# Patient Record
Sex: Female | Born: 1955
Health system: Southern US, Community
[De-identification: ages and names within clinical notes are randomized; demographics above are authoritative.]

## PROBLEM LIST (undated history)

## (undated) DIAGNOSIS — E079 Disorder of thyroid, unspecified: Secondary | ICD-10-CM

## (undated) DIAGNOSIS — I10 Essential (primary) hypertension: Secondary | ICD-10-CM

## (undated) DIAGNOSIS — M1990 Unspecified osteoarthritis, unspecified site: Secondary | ICD-10-CM

## (undated) DIAGNOSIS — Z8701 Personal history of pneumonia (recurrent): Secondary | ICD-10-CM

## (undated) DIAGNOSIS — J189 Pneumonia, unspecified organism: Secondary | ICD-10-CM

## (undated) DIAGNOSIS — T7840XA Allergy, unspecified, initial encounter: Secondary | ICD-10-CM

## (undated) DIAGNOSIS — E039 Hypothyroidism, unspecified: Secondary | ICD-10-CM

## (undated) DIAGNOSIS — K219 Gastro-esophageal reflux disease without esophagitis: Secondary | ICD-10-CM

## (undated) DIAGNOSIS — E669 Obesity, unspecified: Secondary | ICD-10-CM

## (undated) HISTORY — DX: Obesity, unspecified: E66.9

## (undated) HISTORY — PX: COLONOSCOPY: SHX174

## (undated) HISTORY — DX: Essential (primary) hypertension: I10

## (undated) HISTORY — DX: Unspecified osteoarthritis, unspecified site: M19.90

## (undated) HISTORY — PX: UPPER GASTROINTESTINAL ENDOSCOPY: SHX188

## (undated) HISTORY — DX: Allergy, unspecified, initial encounter: T78.40XA

## (undated) HISTORY — DX: Personal history of pneumonia (recurrent): Z87.01

## (undated) HISTORY — DX: Disorder of thyroid, unspecified: E07.9

## (undated) HISTORY — PX: KNEE ARTHROSCOPY: SHX127

## (undated) HISTORY — DX: Gastro-esophageal reflux disease without esophagitis: K21.9

---

## 2003-06-05 ENCOUNTER — Encounter: Admission: RE | Admit: 2003-06-05 | Discharge: 2003-06-05 | Payer: Self-pay | Admitting: Internal Medicine

## 2003-07-16 ENCOUNTER — Other Ambulatory Visit: Admission: RE | Admit: 2003-07-16 | Discharge: 2003-07-16 | Payer: Self-pay | Admitting: Obstetrics & Gynecology

## 2004-06-22 ENCOUNTER — Other Ambulatory Visit: Admission: RE | Admit: 2004-06-22 | Discharge: 2004-06-22 | Payer: Self-pay | Admitting: Obstetrics & Gynecology

## 2007-05-06 ENCOUNTER — Encounter: Admission: RE | Admit: 2007-05-06 | Discharge: 2007-05-06 | Payer: Self-pay | Admitting: Family Medicine

## 2007-05-10 ENCOUNTER — Ambulatory Visit: Payer: Self-pay | Admitting: Gastroenterology

## 2007-06-08 ENCOUNTER — Ambulatory Visit: Payer: Self-pay | Admitting: Gastroenterology

## 2010-03-14 ENCOUNTER — Encounter: Payer: Self-pay | Admitting: Internal Medicine

## 2011-05-25 ENCOUNTER — Telehealth: Payer: Self-pay | Admitting: Gastroenterology

## 2011-05-25 ENCOUNTER — Encounter: Payer: Self-pay | Admitting: Gastroenterology

## 2011-05-25 ENCOUNTER — Ambulatory Visit (INDEPENDENT_AMBULATORY_CARE_PROVIDER_SITE_OTHER): Payer: BC Managed Care – HMO | Admitting: Gastroenterology

## 2011-05-25 VITALS — BP 136/68 | HR 84 | Ht 67.0 in | Wt 237.5 lb

## 2011-05-25 DIAGNOSIS — K219 Gastro-esophageal reflux disease without esophagitis: Secondary | ICD-10-CM

## 2011-05-25 DIAGNOSIS — R1110 Vomiting, unspecified: Secondary | ICD-10-CM | POA: Insufficient documentation

## 2011-05-25 NOTE — Telephone Encounter (Signed)
Spoke with patient and scheduled her to be seen today at 2:15 PM. Lew Dawes aware and she will fax last physical exam on patient.

## 2011-05-25 NOTE — Patient Instructions (Signed)
You have been scheduled for an endoscopy with propofol. Please follow written instructions given to you at your visit today.  

## 2011-05-25 NOTE — Assessment & Plan Note (Signed)
Plan to continue Protonix

## 2011-05-25 NOTE — Assessment & Plan Note (Addendum)
Recurrent postprandial vomiting raises the question of a gastric outlet obstruction. Gastroparesis is also a concern.  Recommendations #1 upper endoscopy #2 gastric emptying scan if #1 is not diagnostic #3 continue protonix

## 2011-05-25 NOTE — Progress Notes (Signed)
History of Present Illness: Stacey Powell is a pleasant 56 year old white female referred at the request of Dr. Chilton Si for evaluation of vomiting. For several months she's been experiencing postprandial vomiting. This usually occurs one to 2 hours after eating. She denies nausea but has a sudden urge to vomit. She denies abdominal pain. She does have pyrosis intermittently for which he takes Protonix. She rarely takes Advil. In the distant past she's had occasional episodes of severe upper abdominal pain but none recently. Weight actually has increased. She denies dysphagia. There has been no change in her bowel habits.    Past Medical History  Diagnosis Date  . Arthritis   . Obesity   . History of pneumonia    Past Surgical History  Procedure Date  . Knee arthroscopy     left knee   family history includes Cancer in her paternal grandmother; Colon polyps in her mother; Cystic fibrosis in her other; Diabetes in her maternal grandfather; Heart disease in her maternal grandfather; and Lung cancer in her paternal grandfather. Current Outpatient Prescriptions  Medication Sig Dispense Refill  . ibuprofen (ADVIL,MOTRIN) 200 MG tablet Take 600 mg by mouth as needed.      . pantoprazole (PROTONIX) 40 MG tablet Take 40 mg by mouth daily.       Allergies as of 05/25/2011 - Review Complete 05/25/2011  Allergen Reaction Noted  . Penicillins  05/25/2011    reports that she quit smoking about 9 years ago. Her smoking use included Cigarettes. She has never used smokeless tobacco. She reports that she drinks alcohol. She reports that she does not use illicit drugs.     Review of Systems: Pertinent positive and negative review of systems were noted in the above HPI section. All other review of systems were otherwise negative.  Vital signs were reviewed in today's medical record Physical Exam: General: Well developed , well nourished, no acute distress Head: Normocephalic and atraumatic Eyes:  sclerae  anicteric, EOMI Ears: Normal auditory acuity Mouth: No deformity or lesions Neck: Supple, no masses or thyromegaly Lungs: Clear throughout to auscultation Heart: Regular rate and rhythm; no murmurs, rubs or bruits Abdomen: Soft, non tender and non distended. No masses, hepatosplenomegaly or hernias noted. Normal Bowel sounds; there is no succussion splash Rectal:deferred Musculoskeletal: Symmetrical with no gross deformities  Skin: No lesions on visible extremities Pulses:  Normal pulses noted Extremities: No clubbing, cyanosis, edema or deformities noted Neurological: Alert oriented x 4, grossly nonfocal Cervical Nodes:  No significant cervical adenopathy Inguinal Nodes: No significant inguinal adenopathy Psychological:  Alert and cooperative. Normal mood and affect

## 2011-06-10 ENCOUNTER — Encounter: Payer: Self-pay | Admitting: Gastroenterology

## 2011-06-10 ENCOUNTER — Other Ambulatory Visit: Payer: Self-pay | Admitting: Gastroenterology

## 2011-06-10 ENCOUNTER — Telehealth: Payer: Self-pay

## 2011-06-10 ENCOUNTER — Ambulatory Visit (AMBULATORY_SURGERY_CENTER): Payer: BC Managed Care – HMO | Admitting: Gastroenterology

## 2011-06-10 VITALS — BP 141/92 | HR 75 | Temp 98.2°F | Resp 17 | Ht 67.0 in | Wt 237.0 lb

## 2011-06-10 DIAGNOSIS — K2970 Gastritis, unspecified, without bleeding: Secondary | ICD-10-CM

## 2011-06-10 DIAGNOSIS — K219 Gastro-esophageal reflux disease without esophagitis: Secondary | ICD-10-CM

## 2011-06-10 DIAGNOSIS — R1110 Vomiting, unspecified: Secondary | ICD-10-CM

## 2011-06-10 DIAGNOSIS — K319 Disease of stomach and duodenum, unspecified: Secondary | ICD-10-CM

## 2011-06-10 MED ORDER — SODIUM CHLORIDE 0.9 % IV SOLN: 500.0000 mL | INTRAVENOUS | Status: DC

## 2011-06-10 NOTE — Telephone Encounter (Signed)
Pt scheduled for GES at [facility] 06/17/11 arrival time 9:45am for a 10am appt. Pt to be NPO after midnight and to hold her Protonix the night before. Pt aware of appt date and time.

## 2011-06-10 NOTE — Patient Instructions (Signed)
Discharge instructions given with verbal understanding. Handouts on esophagitis and gastritis given. Resume previous medications.YOU HAD AN ENDOSCOPIC PROCEDURE TODAY AT THE [facility]: Refer to the procedure report that was given to you for any specific questions about what was found during the examination.  If the procedure report does not answer your questions, please call your gastroenterologist to clarify.  If you requested that your care partner not be given the details of your procedure findings, then the procedure report has been included in a sealed envelope for you to review at your convenience later.  YOU SHOULD EXPECT: Some feelings of bloating in the abdomen. Passage of more gas than usual.  Walking can help get rid of the air that was put into your GI tract during the procedure and reduce the bloating. If you had a lower endoscopy (such as a colonoscopy or flexible sigmoidoscopy) you may notice spotting of blood in your stool or on the toilet paper. If you underwent a bowel prep for your procedure, then you may not have a normal bowel movement for a few days.  DIET: Your first meal following the procedure should be a light meal and then it is ok to progress to your normal diet.  A half-sandwich or bowl of soup is an example of a good first meal.  Heavy or fried foods are harder to digest and may make you feel nauseous or bloated.  Likewise meals heavy in dairy and vegetables can cause extra gas to form and this can also increase the bloating.  Drink plenty of fluids but you should avoid alcoholic beverages for 24 hours.  ACTIVITY: Your care partner should take you home directly after the procedure.  You should plan to take it easy, moving slowly for the rest of the day.  You can resume normal activity the day after the procedure however you should NOT DRIVE or use heavy machinery for 24 hours (because of the sedation medicines used during the test).    SYMPTOMS TO REPORT  IMMEDIATELY: A gastroenterologist can be reached at any hour.  During normal business hours, 8:30 AM to 5:00 PM Monday through Friday, call [phone].  After hours and on weekends, please call the GI answering service at [phone] who will take a message and have the physician on call contact you.   Following upper endoscopy (EGD)  Vomiting of blood or coffee ground material  New chest pain or pain under the shoulder blades  Painful or persistently difficult swallowing  New shortness of breath  Fever of 100F or higher  Black, tarry-looking stools  FOLLOW UP: If any biopsies were taken you will be contacted by phone or by letter within the next 1-3 weeks.  Call your gastroenterologist if you have not heard about the biopsies in 3 weeks.  Our staff will call the home number listed on your records the next business day following your procedure to check on you and address any questions or concerns that you may have at that time regarding the information given to you following your procedure. This is a courtesy call and so if there is no answer at the home number and we have not heard from you through the emergency physician on call, we will assume that you have returned to your regular daily activities without incident.  SIGNATURES/CONFIDENTIALITY: You and/or your care partner have signed paperwork which will be entered into your electronic medical record.  These signatures attest to the fact that that the information above  on your After Visit Summary has been reviewed and is understood.  Full responsibility of the confidentiality of this discharge information lies with you and/or your care-partner.

## 2011-06-10 NOTE — Progress Notes (Signed)
Patient did not experience any of the following events: a burn prior to discharge; a fall within the facility; wrong site/side/patient/procedure/implant event; or a hospital transfer or hospital admission upon discharge from the facility. (G8907) Patient did not have preoperative order for IV antibiotic SSI prophylaxis. (G8918)  

## 2011-06-10 NOTE — Op Note (Signed)
[facility] [address] [place], [place]  [address]  ENDOSCOPY PROCEDURE REPORT  PATIENT:  Stacey Powell, Stacey Powell  MR#:  [id number] BIRTHDATE:  11-01-1955, 55 yrs. old  GENDER:  female  ENDOSCOPIST:  Barbette Hair. Arlyce Dice, MD Referred by:  Nila Nephew, M.D.  PROCEDURE DATE:  06/10/2011 PROCEDURE:  EGD with biopsy, 43239 ASA CLASS:  Class II INDICATIONS:  nausea and vomiting  MEDICATIONS:   MAC sedation, administered by CRNA propofol 240mg IV, glycopyrrolate (Robinal) 0.2 mg IV, 0.6cc simethancone 0.6 cc PO TOPICAL ANESTHETIC:  DESCRIPTION OF PROCEDURE:   After the risks and benefits of the procedure were explained, informed consent was obtained.  The LB GIF-H180 [id number] endoscope was introduced through the mouth and advanced to the third portion of the duodenum.  The instrument was slowly withdrawn as the mucosa was fully examined. <<PROCEDUREIMAGES>>  Esophagitis was found at the gastroesophageal junction (see image6 and image5). Grade A esophagitis  Mild gastritis was found. Nonerosive gastritis in antrum. Bxs taken (see image3).  Otherwise the examination was normal (see image2 and image4).    Retroflexed views revealed no abnormalities.    The scope was then withdrawn from the patient and the procedure completed.  COMPLICATIONS:  None  ENDOSCOPIC IMPRESSION: 1) Esophagitis at the gastroesophageal junction 2) Mild gastritis 3) Otherwise normal examination  Findings do not explain etiology for nausea and vomiting  RECOMMENDATIONS: 1) My office will arrange for you to have a Gastric Emptying Scan performed. This is a radiology test that gives an idea of how well your stomach functions.  ______________________________ Barbette Hair Arlyce Dice, MD  CC:  n. eSIGNED:   Barbette Hair. Mabry Santarelli at 06/10/2011 01:57 PM  Kem Kays, [phone]

## 2011-06-13 ENCOUNTER — Telehealth: Payer: Self-pay | Admitting: *Deleted

## 2011-06-13 NOTE — Telephone Encounter (Signed)
  Follow up Call-  Call back number 06/10/2011  Post procedure Call Back phone  # [phone]  Permission to leave phone message Yes     Patient questions:  Do you have a fever, pain , or abdominal swelling? no Pain Score  0 *  Have you tolerated food without any problems? yes  Have you been able to return to your normal activities? yes  Do you have any questions about your discharge instructions: Diet   no Medications  no Follow up visit  no  Do you have questions or concerns about your Care? no  Actions: * If pain score is 4 or above: No action needed, pain <4. Pt. Had question about where [facility] had scheduled her procedure, she wanted to know if it was going to be done [facility] or [facility] site. Recommended to pt. To call hospital to verify site she should go to and phone number given to each site.

## 2011-06-17 ENCOUNTER — Encounter (HOSPITAL_COMMUNITY)
Admission: RE | Admit: 2011-06-17 | Discharge: 2011-06-17 | Disposition: A | Discharge status: Home / Self Care | Payer: BC Managed Care – HMO | Source: Ambulatory Visit | Attending: Gastroenterology | Admitting: Gastroenterology

## 2011-06-17 DIAGNOSIS — R109 Unspecified abdominal pain: Secondary | ICD-10-CM | POA: Insufficient documentation

## 2011-06-17 MED ORDER — TECHNETIUM TC 99M SULFUR COLLOID
2.0000 | Freq: Once | INTRAVENOUS | Status: AC | PRN
Start: 2011-06-17 — End: 2011-06-17
  Administered 2011-06-17: 2 via INTRAVENOUS

## 2011-06-20 ENCOUNTER — Telehealth: Payer: Self-pay | Admitting: *Deleted

## 2011-06-20 NOTE — Telephone Encounter (Signed)
Message copied by Marlowe Kays on Mon Jun 20, 2011 12:50 PM ------      Message from: Melvia Heaps D      Created: Fri Jun 17, 2011  1:58 PM       GES in normal.  Please order SBFT

## 2011-06-21 ENCOUNTER — Encounter: Payer: Self-pay | Admitting: Gastroenterology

## 2011-06-22 ENCOUNTER — Ambulatory Visit: Payer: Self-pay | Admitting: Gastroenterology

## 2011-06-24 ENCOUNTER — Ambulatory Visit (HOSPITAL_COMMUNITY)
Admission: RE | Admit: 2011-06-24 | Discharge: 2011-06-24 | Disposition: A | Discharge status: Home / Self Care | Payer: BC Managed Care – HMO | Source: Ambulatory Visit | Attending: Gastroenterology | Admitting: Gastroenterology

## 2011-06-24 DIAGNOSIS — R109 Unspecified abdominal pain: Secondary | ICD-10-CM | POA: Insufficient documentation

## 2011-07-19 ENCOUNTER — Ambulatory Visit (INDEPENDENT_AMBULATORY_CARE_PROVIDER_SITE_OTHER): Payer: 59 | Admitting: Gastroenterology

## 2011-07-19 ENCOUNTER — Encounter: Payer: Self-pay | Admitting: Gastroenterology

## 2011-07-19 VITALS — BP 126/68 | HR 64 | Ht 67.0 in | Wt 239.8 lb

## 2011-07-19 DIAGNOSIS — R1110 Vomiting, unspecified: Secondary | ICD-10-CM

## 2011-07-19 NOTE — Assessment & Plan Note (Signed)
This has resolved. I suspect that nausea and vomiting are diet-related. She was encouraged to limit fatty foods.

## 2011-07-19 NOTE — Patient Instructions (Signed)
Follow up as needed

## 2011-07-19 NOTE — Progress Notes (Signed)
History of Present Illness:  Mrs. Greggs has returned for followup of nausea. Upper endoscopy demonstrated mild gastritis. Gastric emptying scan and upper GI series were unremarkable. With mild dietary restrictions her nausea has subsided. Specifically, she has reduced fatty foods including meats and has noted marked improvement in her symptoms    Review of Systems: She complains of left knee pain that limits her ability to exercise Pertinent positive and negative review of systems were noted in the above HPI section. All other review of systems were otherwise negative.    Current Medications, Allergies, Past Medical History, Past Surgical History, Family History and Social History were reviewed in [employer] electronic medical record  Vital signs were reviewed in today's medical record. Physical Exam: General: Well developed , well nourished, no acute distress

## 2013-11-04 ENCOUNTER — Encounter: Payer: Self-pay | Admitting: Gastroenterology

## 2016-06-16 DIAGNOSIS — F419 Anxiety disorder, unspecified: Secondary | ICD-10-CM | POA: Diagnosis not present

## 2016-06-16 DIAGNOSIS — Z0000 Encounter for general adult medical examination without abnormal findings: Secondary | ICD-10-CM | POA: Diagnosis not present

## 2016-06-16 DIAGNOSIS — Z6841 Body Mass Index (BMI) 40.0 and over, adult: Secondary | ICD-10-CM | POA: Diagnosis not present

## 2016-06-16 DIAGNOSIS — E039 Hypothyroidism, unspecified: Secondary | ICD-10-CM | POA: Diagnosis not present

## 2016-06-16 DIAGNOSIS — I10 Essential (primary) hypertension: Secondary | ICD-10-CM | POA: Diagnosis not present

## 2016-08-04 ENCOUNTER — Other Ambulatory Visit: Payer: Self-pay | Admitting: Internal Medicine

## 2016-08-04 ENCOUNTER — Ambulatory Visit
Admission: RE | Admit: 2016-08-04 | Discharge: 2016-08-04 | Disposition: A | Discharge status: Home / Self Care | Payer: BLUE CROSS/BLUE SHIELD | Source: Ambulatory Visit | Attending: Internal Medicine | Admitting: Internal Medicine

## 2016-08-04 DIAGNOSIS — R2242 Localized swelling, mass and lump, left lower limb: Secondary | ICD-10-CM

## 2016-08-04 DIAGNOSIS — I10 Essential (primary) hypertension: Secondary | ICD-10-CM | POA: Diagnosis not present

## 2016-08-04 DIAGNOSIS — M79672 Pain in left foot: Secondary | ICD-10-CM | POA: Diagnosis not present

## 2016-12-07 DIAGNOSIS — Z1231 Encounter for screening mammogram for malignant neoplasm of breast: Secondary | ICD-10-CM | POA: Diagnosis not present

## 2016-12-08 ENCOUNTER — Other Ambulatory Visit: Payer: Self-pay | Admitting: Internal Medicine

## 2016-12-08 DIAGNOSIS — R2242 Localized swelling, mass and lump, left lower limb: Secondary | ICD-10-CM

## 2016-12-08 DIAGNOSIS — F419 Anxiety disorder, unspecified: Secondary | ICD-10-CM | POA: Diagnosis not present

## 2016-12-08 DIAGNOSIS — Z23 Encounter for immunization: Secondary | ICD-10-CM | POA: Diagnosis not present

## 2016-12-08 DIAGNOSIS — I10 Essential (primary) hypertension: Secondary | ICD-10-CM | POA: Diagnosis not present

## 2016-12-09 ENCOUNTER — Ambulatory Visit
Admission: RE | Admit: 2016-12-09 | Discharge: 2016-12-09 | Disposition: A | Discharge status: Home / Self Care | Payer: BLUE CROSS/BLUE SHIELD | Source: Ambulatory Visit | Attending: Internal Medicine | Admitting: Internal Medicine

## 2016-12-09 DIAGNOSIS — R600 Localized edema: Secondary | ICD-10-CM | POA: Diagnosis not present

## 2016-12-09 DIAGNOSIS — R2242 Localized swelling, mass and lump, left lower limb: Secondary | ICD-10-CM

## 2017-04-04 DIAGNOSIS — I10 Essential (primary) hypertension: Secondary | ICD-10-CM | POA: Diagnosis not present

## 2017-04-04 DIAGNOSIS — F419 Anxiety disorder, unspecified: Secondary | ICD-10-CM | POA: Diagnosis not present

## 2017-04-04 DIAGNOSIS — E039 Hypothyroidism, unspecified: Secondary | ICD-10-CM | POA: Diagnosis not present

## 2017-04-06 ENCOUNTER — Encounter: Payer: Self-pay | Admitting: Gastroenterology

## 2017-05-05 ENCOUNTER — Encounter: Payer: Self-pay | Admitting: *Deleted

## 2017-05-25 ENCOUNTER — Ambulatory Visit (AMBULATORY_SURGERY_CENTER): Payer: Self-pay | Admitting: *Deleted

## 2017-05-25 ENCOUNTER — Other Ambulatory Visit: Payer: Self-pay

## 2017-05-25 VITALS — Ht 67.0 in | Wt 249.0 lb

## 2017-05-25 DIAGNOSIS — Z1211 Encounter for screening for malignant neoplasm of colon: Secondary | ICD-10-CM

## 2017-05-25 NOTE — Progress Notes (Signed)
No egg or soy allergy known to patient  No issues with past sedation with any surgeries  or procedures, no intubation problems  No diet pills per patient No home 02 use per patient  No blood thinners per patient  Pt denies issues with constipation  No A fib or A flutter  EMMI video sent to pt's e mail - pt declined  plenvu sample  [id number]  Exp 07-2018  As directed- not covered by [employer] - pt not wanting to pay $50

## 2017-05-26 ENCOUNTER — Encounter: Payer: Self-pay | Admitting: Gastroenterology

## 2017-06-08 ENCOUNTER — Other Ambulatory Visit: Payer: Self-pay

## 2017-06-08 ENCOUNTER — Encounter: Payer: Self-pay | Admitting: Gastroenterology

## 2017-06-08 ENCOUNTER — Ambulatory Visit (AMBULATORY_SURGERY_CENTER): Payer: BLUE CROSS/BLUE SHIELD | Admitting: Gastroenterology

## 2017-06-08 VITALS — BP 136/62 | HR 59 | Temp 97.3°F | Resp 14 | Ht 67.0 in | Wt 249.0 lb

## 2017-06-08 DIAGNOSIS — Z1212 Encounter for screening for malignant neoplasm of rectum: Secondary | ICD-10-CM

## 2017-06-08 DIAGNOSIS — Z1211 Encounter for screening for malignant neoplasm of colon: Secondary | ICD-10-CM

## 2017-06-08 MED ORDER — SODIUM CHLORIDE 0.9 % IV SOLN: 500.0000 mL | Freq: Once | INTRAVENOUS | Status: DC

## 2017-06-08 NOTE — Patient Instructions (Signed)
**  Handout given on Diverticulosis**   YOU HAD AN ENDOSCOPIC PROCEDURE TODAY: Refer to the procedure report and other information in the discharge instructions given to you for any specific questions about what was found during the examination. If this information does not answer your questions, please call [facility] office at [phone] to clarify.   YOU SHOULD EXPECT: Some feelings of bloating in the abdomen. Passage of more gas than usual. Walking can help get rid of the air that was put into your GI tract during the procedure and reduce the bloating. If you had a lower endoscopy (such as a colonoscopy or flexible sigmoidoscopy) you may notice spotting of blood in your stool or on the toilet paper. Some abdominal soreness may be present for a day or two, also.  DIET: Your first meal following the procedure should be a light meal and then it is ok to progress to your normal diet. A half-sandwich or bowl of soup is an example of a good first meal. Heavy or fried foods are harder to digest and may make you feel nauseous or bloated. Drink plenty of fluids but you should avoid alcoholic beverages for 24 hours. If you had a esophageal dilation, please see attached instructions for diet.    ACTIVITY: Your care partner should take you home directly after the procedure. You should plan to take it easy, moving slowly for the rest of the day. You can resume normal activity the day after the procedure however YOU SHOULD NOT DRIVE, use power tools, machinery or perform tasks that involve climbing or major physical exertion for 24 hours (because of the sedation medicines used during the test).   SYMPTOMS TO REPORT IMMEDIATELY: A gastroenterologist can be reached at any hour. Please call [phone]  for any of the following symptoms:  Following lower endoscopy (colonoscopy, flexible sigmoidoscopy) Excessive amounts of blood in the stool  Significant tenderness, worsening of abdominal pains  Swelling of the  abdomen that is new, acute  Fever of 100 or higher    FOLLOW UP:  If any biopsies were taken you will be contacted by phone or by letter within the next 1-3 weeks. Call [phone]  if you have not heard about the biopsies in 3 weeks.  Please also call with any specific questions about appointments or follow up tests.  

## 2017-06-08 NOTE — Progress Notes (Signed)
Pt's states no medical or surgical changes since previsit or office visit. 

## 2017-06-08 NOTE — Progress Notes (Signed)
To recovery, report to RN, VSS. 

## 2017-06-08 NOTE — Op Note (Signed)
[facility] Patient Name: Stacey Powell Procedure Date: 06/08/2017 9:29 AM MRN: [id number] Endoscopist: Sherilyn Cooter L. Myrtie Neither , MD Age: 62 Referring MD:  Date of Birth: Oct 07, 1955 Gender: Female Account #: [id number] Procedure:                Colonoscopy Indications:              Screening for colorectal malignant neoplasm (no                            polyps 05/2007) Medicines:                Monitored Anesthesia Care Procedure:                Pre-Anesthesia Assessment:                           - Prior to the procedure, a History and Physical                            was performed, and patient medications and                            allergies were reviewed. The patient's tolerance of                            previous anesthesia was also reviewed. The risks                            and benefits of the procedure and the sedation                            options and risks were discussed with the patient.                            All questions were answered, and informed consent                            was obtained. Prior Anticoagulants: The patient has                            taken no previous anticoagulant or antiplatelet                            agents. ASA Grade Assessment: III - A patient with                            severe systemic disease. After reviewing the risks                            and benefits, the patient was deemed in                            satisfactory condition to undergo the procedure.  After obtaining informed consent, the colonoscope                            was passed under direct vision. Throughout the                            procedure, the patient's blood pressure, pulse, and                            oxygen saturations were monitored continuously. The                            Colonoscope was introduced through the anus and                            advanced to the the cecum, identified by                          appendiceal orifice and ileocecal valve. The                            colonoscopy was performed without difficulty. The                            patient tolerated the procedure well. The quality                            of the bowel preparation was good. The ileocecal                            valve, appendiceal orifice, and rectum were                            photographed. The quality of the bowel preparation                            was evaluated using the BBPS [facility]([place] Bowel                            Preparation Scale) with scores of: Right Colon = 2,                            Transverse Colon = 2 and Left Colon = 2. The total                            BBPS score equals 6. The bowel preparation used was                            Plenvu. Scope In: 9:43:54 AM Scope Out: 9:57:46 AM Scope Withdrawal Time: 0 hours 9 minutes 40 seconds  Total Procedure Duration: 0 hours 13 minutes 52 seconds  Findings:                 The perianal and digital rectal examinations were  normal.                           Many diverticula were found in the entire colon.                           The exam was otherwise without abnormality on                            direct and retroflexion views. Complications:            No immediate complications. Estimated Blood Loss:     Estimated blood loss: none. Impression:               - Diverticulosis in the entire examined colon.                           - The examination was otherwise normal on direct                            and retroflexion views.                           - No specimens collected. Recommendation:           - Patient has a contact number available for                            emergencies. The signs and symptoms of potential                            delayed complications were discussed with the                            patient. Return to normal activities tomorrow.                             Written discharge instructions were provided to the                            patient.                           - Resume previous diet.                           - Continue present medications.                           - Repeat colonoscopy in 10 years for screening                            purposes.  L. Myrtie Neither, MD 06/08/2017 10:01:08 AM This report has been signed electronically.

## 2017-06-12 ENCOUNTER — Telehealth: Payer: Self-pay

## 2017-06-12 NOTE — Telephone Encounter (Signed)
  Follow up Call-  Call back number 06/08/2017  Post procedure Call Back phone  # [phone]  Permission to leave phone message Yes  Some recent data might be hidden     Patient questions:  Do you have a fever, pain , or abdominal swelling? No. Pain Score  0 *  Have you tolerated food without any problems? Yes.    Have you been able to return to your normal activities? Yes.    Do you have any questions about your discharge instructions: Diet   No. Medications  No. Follow up visit  No.  Do you have questions or concerns about your Care? No.  Actions: * If pain score is 4 or above: No action needed, pain <4.

## 2017-06-19 DIAGNOSIS — Z0000 Encounter for general adult medical examination without abnormal findings: Secondary | ICD-10-CM | POA: Diagnosis not present

## 2017-06-19 DIAGNOSIS — K219 Gastro-esophageal reflux disease without esophagitis: Secondary | ICD-10-CM | POA: Diagnosis not present

## 2017-06-19 DIAGNOSIS — I10 Essential (primary) hypertension: Secondary | ICD-10-CM | POA: Diagnosis not present

## 2017-06-19 DIAGNOSIS — F419 Anxiety disorder, unspecified: Secondary | ICD-10-CM | POA: Diagnosis not present

## 2017-06-22 ENCOUNTER — Other Ambulatory Visit: Payer: Self-pay | Admitting: Internal Medicine

## 2017-06-22 DIAGNOSIS — N289 Disorder of kidney and ureter, unspecified: Secondary | ICD-10-CM

## 2017-06-27 ENCOUNTER — Ambulatory Visit
Admission: RE | Admit: 2017-06-27 | Discharge: 2017-06-27 | Disposition: A | Discharge status: Home / Self Care | Payer: BLUE CROSS/BLUE SHIELD | Source: Ambulatory Visit | Attending: Internal Medicine | Admitting: Internal Medicine

## 2017-06-27 DIAGNOSIS — R3129 Other microscopic hematuria: Secondary | ICD-10-CM | POA: Diagnosis not present

## 2017-06-27 DIAGNOSIS — N289 Disorder of kidney and ureter, unspecified: Secondary | ICD-10-CM

## 2017-07-12 ENCOUNTER — Other Ambulatory Visit: Payer: BLUE CROSS/BLUE SHIELD

## 2017-08-01 DIAGNOSIS — H43813 Vitreous degeneration, bilateral: Secondary | ICD-10-CM | POA: Diagnosis not present

## 2017-08-28 DIAGNOSIS — I10 Essential (primary) hypertension: Secondary | ICD-10-CM | POA: Diagnosis not present

## 2017-08-28 DIAGNOSIS — K219 Gastro-esophageal reflux disease without esophagitis: Secondary | ICD-10-CM | POA: Diagnosis not present

## 2017-08-28 DIAGNOSIS — F419 Anxiety disorder, unspecified: Secondary | ICD-10-CM | POA: Diagnosis not present

## 2017-08-28 DIAGNOSIS — R319 Hematuria, unspecified: Secondary | ICD-10-CM | POA: Diagnosis not present

## 2017-09-08 DIAGNOSIS — J019 Acute sinusitis, unspecified: Secondary | ICD-10-CM | POA: Diagnosis not present

## 2017-12-08 DIAGNOSIS — Z1231 Encounter for screening mammogram for malignant neoplasm of breast: Secondary | ICD-10-CM | POA: Diagnosis not present

## 2017-12-19 DIAGNOSIS — K219 Gastro-esophageal reflux disease without esophagitis: Secondary | ICD-10-CM | POA: Diagnosis not present

## 2017-12-19 DIAGNOSIS — I10 Essential (primary) hypertension: Secondary | ICD-10-CM | POA: Diagnosis not present

## 2017-12-19 DIAGNOSIS — F419 Anxiety disorder, unspecified: Secondary | ICD-10-CM | POA: Diagnosis not present

## 2018-06-21 ENCOUNTER — Other Ambulatory Visit: Payer: Self-pay | Admitting: Internal Medicine

## 2018-06-21 DIAGNOSIS — R109 Unspecified abdominal pain: Secondary | ICD-10-CM

## 2018-06-21 DIAGNOSIS — R319 Hematuria, unspecified: Secondary | ICD-10-CM | POA: Diagnosis not present

## 2018-06-21 DIAGNOSIS — K219 Gastro-esophageal reflux disease without esophagitis: Secondary | ICD-10-CM | POA: Diagnosis not present

## 2018-06-21 DIAGNOSIS — E039 Hypothyroidism, unspecified: Secondary | ICD-10-CM | POA: Diagnosis not present

## 2018-06-21 DIAGNOSIS — I10 Essential (primary) hypertension: Secondary | ICD-10-CM | POA: Diagnosis not present

## 2018-06-28 ENCOUNTER — Other Ambulatory Visit: Payer: Self-pay

## 2018-06-28 ENCOUNTER — Ambulatory Visit
Admission: RE | Admit: 2018-06-28 | Discharge: 2018-06-28 | Disposition: A | Discharge status: Home / Self Care | Payer: BLUE CROSS/BLUE SHIELD | Source: Ambulatory Visit | Attending: Internal Medicine | Admitting: Internal Medicine

## 2018-06-28 DIAGNOSIS — R109 Unspecified abdominal pain: Secondary | ICD-10-CM

## 2018-06-28 DIAGNOSIS — R319 Hematuria, unspecified: Secondary | ICD-10-CM | POA: Diagnosis not present

## 2018-11-18 IMAGING — US US EXTREM LOW*L* LIMITED
1 series · 14 of 16 positions shown · non-contrast
Comparison: Left foot radiographs 08/04/2016

CLINICAL DATA: 61-year-old female with a palpable abnormality rest
of the lateral aspect of the left foot

EXAM:
ULTRASOUND LEFT  LOWER EXTREMITY LIMITED
TECHNIQUE: Ultrasound examination of the lower extremity soft tissues was
performed in the area of clinical concern.

[Series 1: us extrem low*left* limited · 0.05mm/px · 16 acquisitions, 14 frames shown]
[im 1/16]
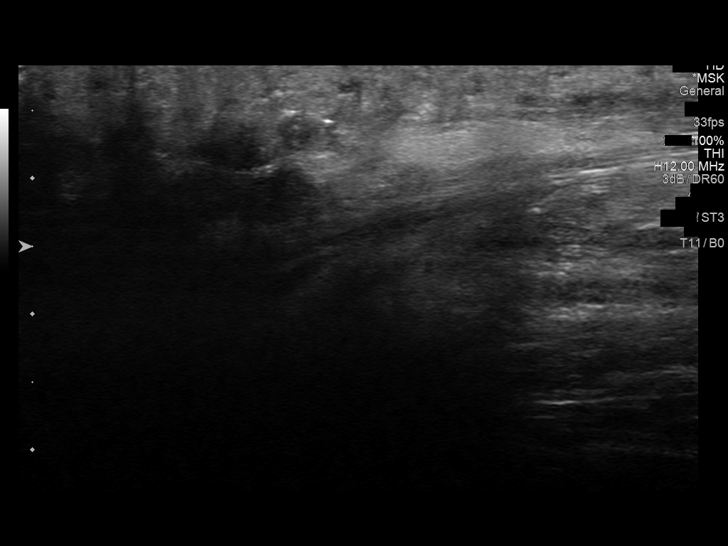
[im 2/16]
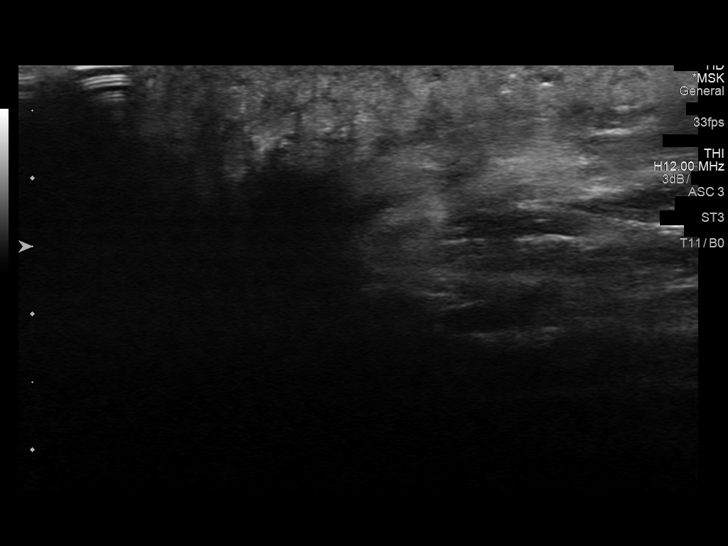
[im 3/16]
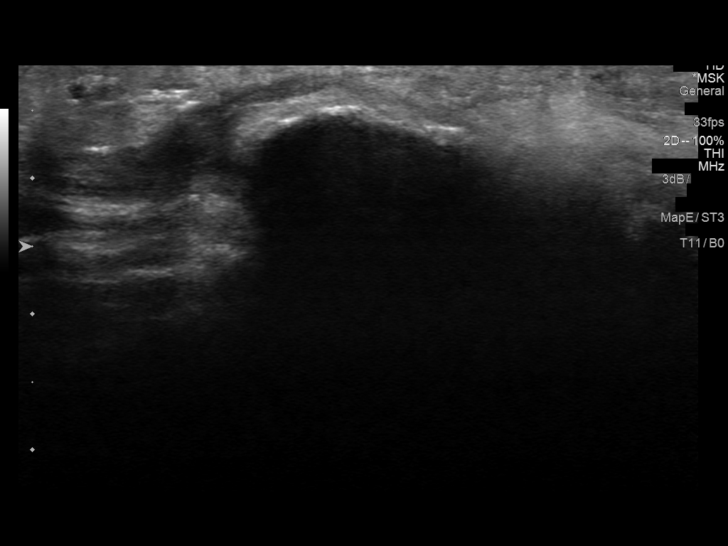
[im 5/16]
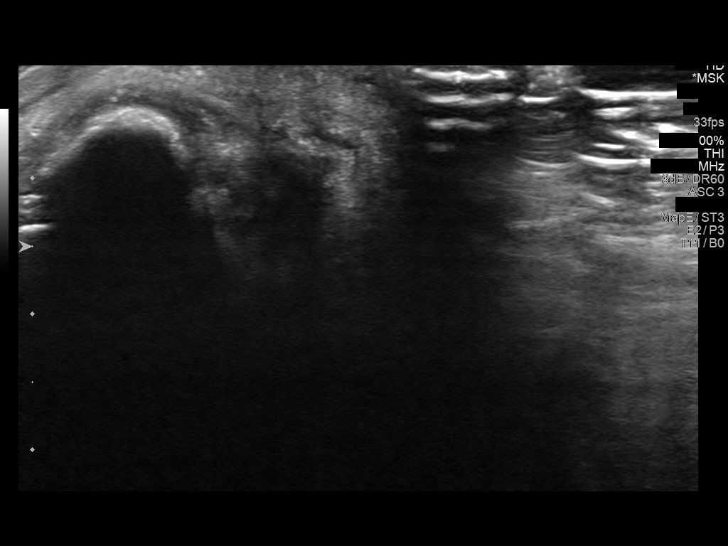
[im 6/16]
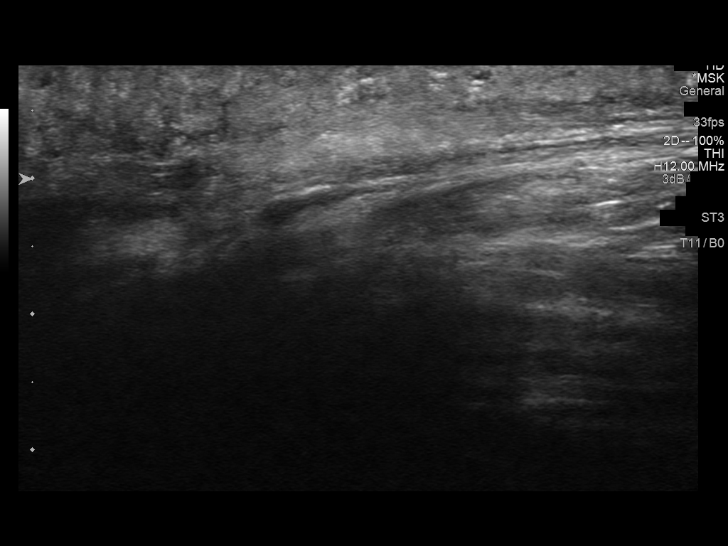
[im 7/16]
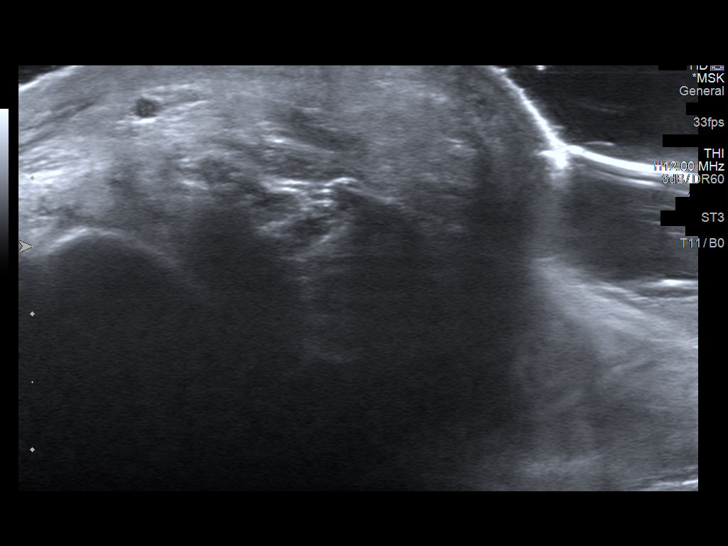
[im 8/16]
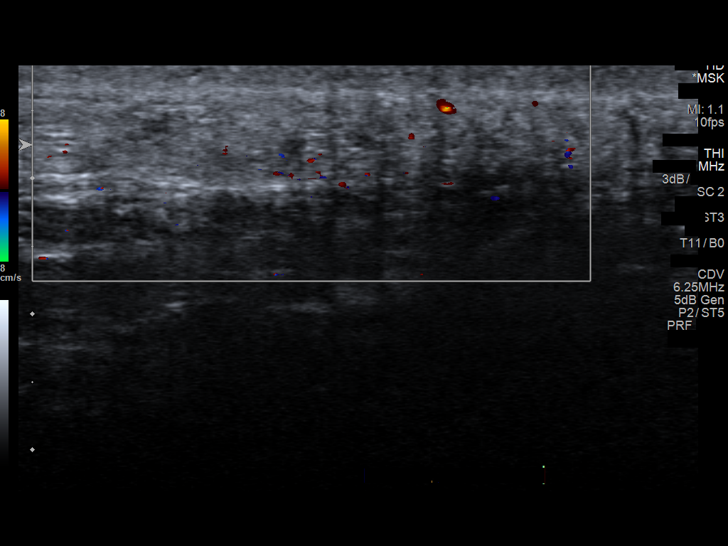
[im 9/16]
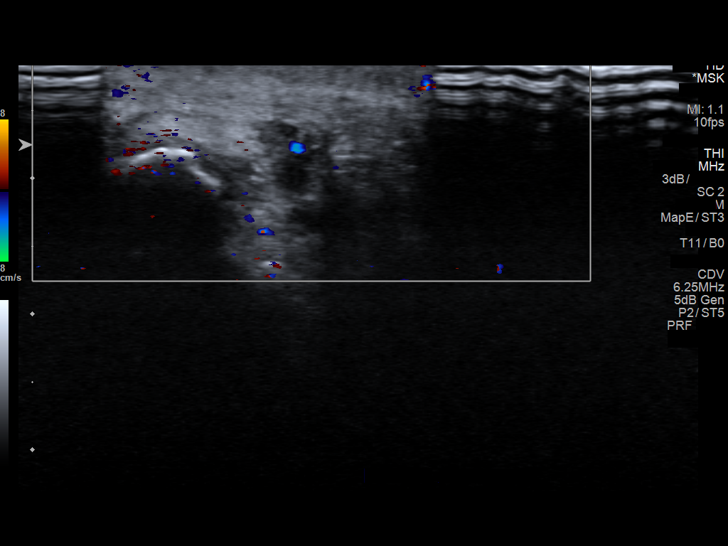
[im 10/16]
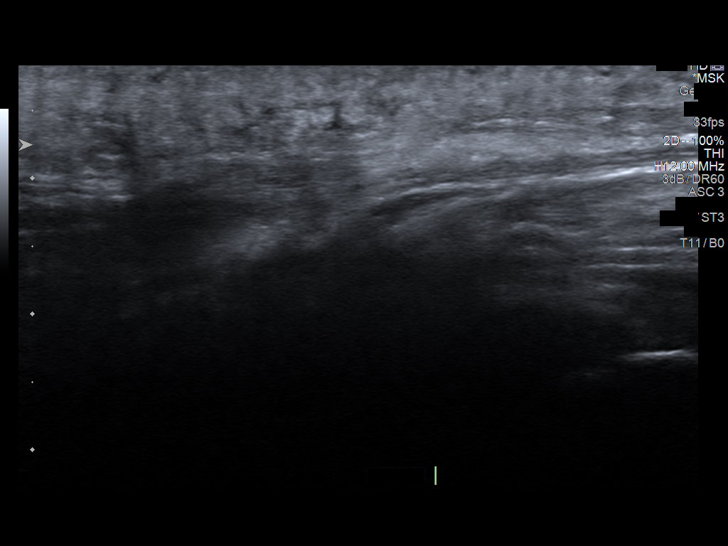
[im 11/16]
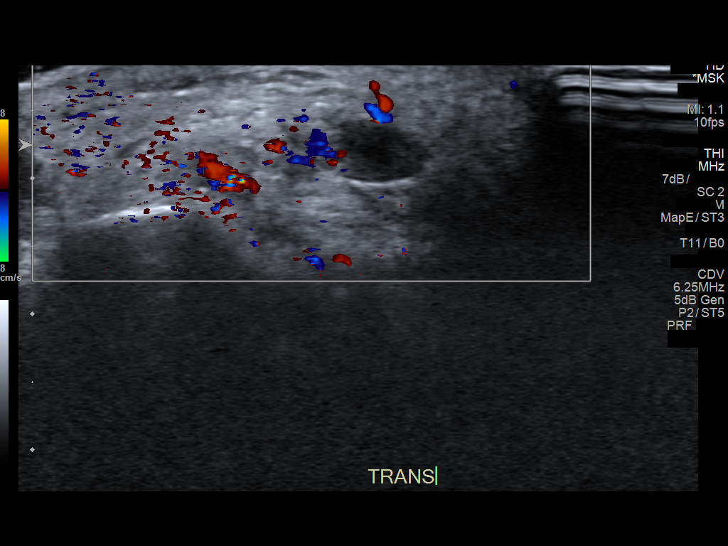
[im 13/16]
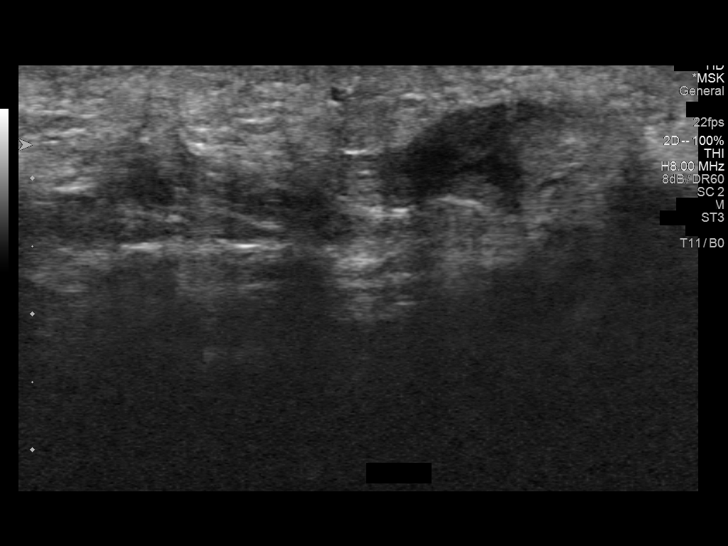
[im 14/16]
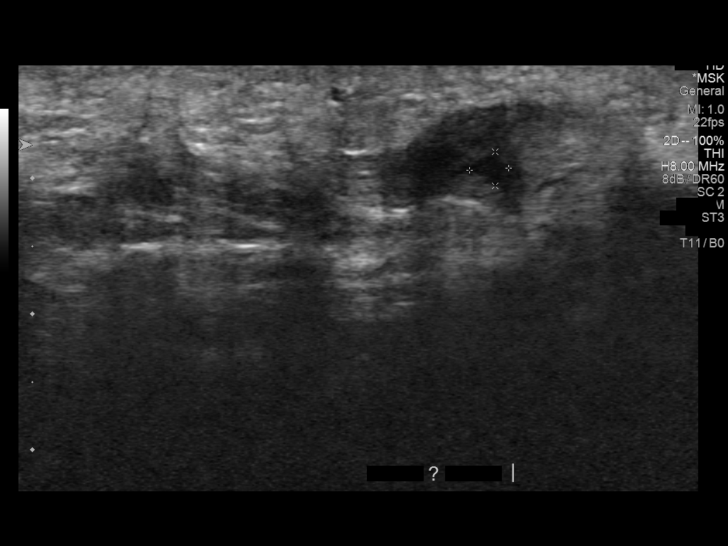
[im 15/16]
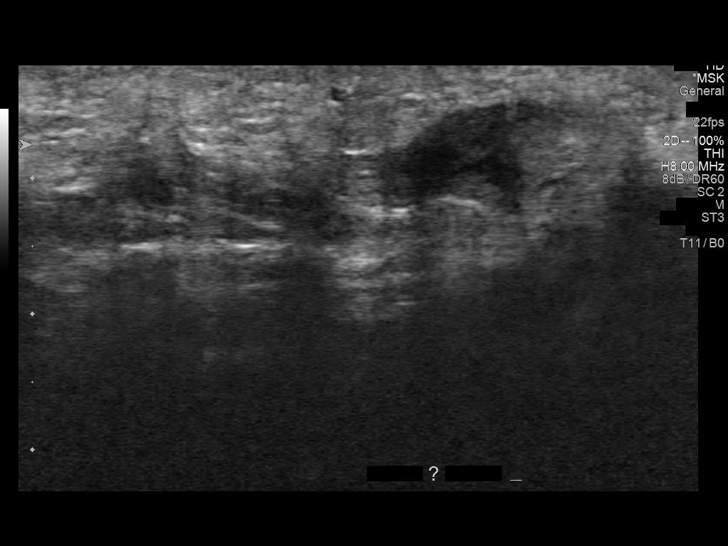
[im 16/16]
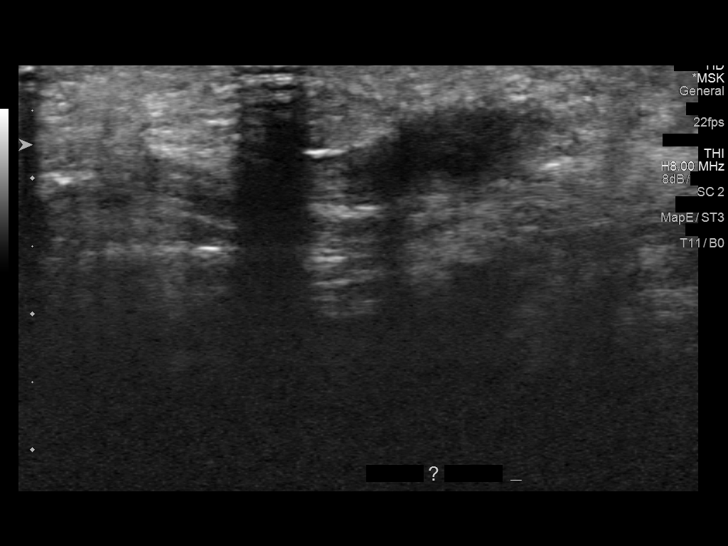

[14 of 16 positions shown; findings below may reference images not displayed]

FINDINGS: No definite soft tissue mass in the region of clinical concern.
There is a small collection of hypoechoic material which may
represent edema, or fluid.
IMPRESSION: Small amount of edema or fluid in the region of clinical concern at
the lateral aspect of the base of the fifth metatarsal. No definite
solid mass.

Consider further evaluation with MRI of the foot with without
contrast.

## 2018-12-18 DIAGNOSIS — M9901 Segmental and somatic dysfunction of cervical region: Secondary | ICD-10-CM | POA: Diagnosis not present

## 2018-12-18 DIAGNOSIS — M9905 Segmental and somatic dysfunction of pelvic region: Secondary | ICD-10-CM | POA: Diagnosis not present

## 2018-12-18 DIAGNOSIS — M50322 Other cervical disc degeneration at C5-C6 level: Secondary | ICD-10-CM | POA: Diagnosis not present

## 2018-12-18 DIAGNOSIS — M25551 Pain in right hip: Secondary | ICD-10-CM | POA: Diagnosis not present

## 2018-12-19 DIAGNOSIS — M50322 Other cervical disc degeneration at C5-C6 level: Secondary | ICD-10-CM | POA: Diagnosis not present

## 2018-12-19 DIAGNOSIS — M9905 Segmental and somatic dysfunction of pelvic region: Secondary | ICD-10-CM | POA: Diagnosis not present

## 2018-12-19 DIAGNOSIS — M9901 Segmental and somatic dysfunction of cervical region: Secondary | ICD-10-CM | POA: Diagnosis not present

## 2018-12-19 DIAGNOSIS — M25551 Pain in right hip: Secondary | ICD-10-CM | POA: Diagnosis not present

## 2018-12-20 DIAGNOSIS — M25551 Pain in right hip: Secondary | ICD-10-CM | POA: Diagnosis not present

## 2018-12-20 DIAGNOSIS — M9905 Segmental and somatic dysfunction of pelvic region: Secondary | ICD-10-CM | POA: Diagnosis not present

## 2018-12-20 DIAGNOSIS — M50322 Other cervical disc degeneration at C5-C6 level: Secondary | ICD-10-CM | POA: Diagnosis not present

## 2018-12-20 DIAGNOSIS — M9901 Segmental and somatic dysfunction of cervical region: Secondary | ICD-10-CM | POA: Diagnosis not present

## 2018-12-21 DIAGNOSIS — Z1231 Encounter for screening mammogram for malignant neoplasm of breast: Secondary | ICD-10-CM | POA: Diagnosis not present

## 2018-12-24 DIAGNOSIS — M9905 Segmental and somatic dysfunction of pelvic region: Secondary | ICD-10-CM | POA: Diagnosis not present

## 2018-12-24 DIAGNOSIS — M25551 Pain in right hip: Secondary | ICD-10-CM | POA: Diagnosis not present

## 2018-12-24 DIAGNOSIS — M9901 Segmental and somatic dysfunction of cervical region: Secondary | ICD-10-CM | POA: Diagnosis not present

## 2018-12-24 DIAGNOSIS — M50322 Other cervical disc degeneration at C5-C6 level: Secondary | ICD-10-CM | POA: Diagnosis not present

## 2018-12-27 DIAGNOSIS — M9901 Segmental and somatic dysfunction of cervical region: Secondary | ICD-10-CM | POA: Diagnosis not present

## 2018-12-27 DIAGNOSIS — M50322 Other cervical disc degeneration at C5-C6 level: Secondary | ICD-10-CM | POA: Diagnosis not present

## 2018-12-27 DIAGNOSIS — M25551 Pain in right hip: Secondary | ICD-10-CM | POA: Diagnosis not present

## 2018-12-27 DIAGNOSIS — M9905 Segmental and somatic dysfunction of pelvic region: Secondary | ICD-10-CM | POA: Diagnosis not present

## 2018-12-31 DIAGNOSIS — M9905 Segmental and somatic dysfunction of pelvic region: Secondary | ICD-10-CM | POA: Diagnosis not present

## 2018-12-31 DIAGNOSIS — M25551 Pain in right hip: Secondary | ICD-10-CM | POA: Diagnosis not present

## 2018-12-31 DIAGNOSIS — M9901 Segmental and somatic dysfunction of cervical region: Secondary | ICD-10-CM | POA: Diagnosis not present

## 2018-12-31 DIAGNOSIS — M50322 Other cervical disc degeneration at C5-C6 level: Secondary | ICD-10-CM | POA: Diagnosis not present

## 2019-01-03 DIAGNOSIS — M50322 Other cervical disc degeneration at C5-C6 level: Secondary | ICD-10-CM | POA: Diagnosis not present

## 2019-01-03 DIAGNOSIS — M25551 Pain in right hip: Secondary | ICD-10-CM | POA: Diagnosis not present

## 2019-01-03 DIAGNOSIS — M9901 Segmental and somatic dysfunction of cervical region: Secondary | ICD-10-CM | POA: Diagnosis not present

## 2019-01-03 DIAGNOSIS — M9905 Segmental and somatic dysfunction of pelvic region: Secondary | ICD-10-CM | POA: Diagnosis not present

## 2019-01-07 DIAGNOSIS — M9905 Segmental and somatic dysfunction of pelvic region: Secondary | ICD-10-CM | POA: Diagnosis not present

## 2019-01-07 DIAGNOSIS — M50322 Other cervical disc degeneration at C5-C6 level: Secondary | ICD-10-CM | POA: Diagnosis not present

## 2019-01-07 DIAGNOSIS — M25551 Pain in right hip: Secondary | ICD-10-CM | POA: Diagnosis not present

## 2019-01-07 DIAGNOSIS — M9901 Segmental and somatic dysfunction of cervical region: Secondary | ICD-10-CM | POA: Diagnosis not present

## 2019-01-10 DIAGNOSIS — M50322 Other cervical disc degeneration at C5-C6 level: Secondary | ICD-10-CM | POA: Diagnosis not present

## 2019-01-10 DIAGNOSIS — M25551 Pain in right hip: Secondary | ICD-10-CM | POA: Diagnosis not present

## 2019-01-10 DIAGNOSIS — M9901 Segmental and somatic dysfunction of cervical region: Secondary | ICD-10-CM | POA: Diagnosis not present

## 2019-01-10 DIAGNOSIS — M9905 Segmental and somatic dysfunction of pelvic region: Secondary | ICD-10-CM | POA: Diagnosis not present

## 2019-01-14 DIAGNOSIS — M9905 Segmental and somatic dysfunction of pelvic region: Secondary | ICD-10-CM | POA: Diagnosis not present

## 2019-01-14 DIAGNOSIS — M25551 Pain in right hip: Secondary | ICD-10-CM | POA: Diagnosis not present

## 2019-01-14 DIAGNOSIS — M50322 Other cervical disc degeneration at C5-C6 level: Secondary | ICD-10-CM | POA: Diagnosis not present

## 2019-01-14 DIAGNOSIS — M9901 Segmental and somatic dysfunction of cervical region: Secondary | ICD-10-CM | POA: Diagnosis not present

## 2019-01-21 DIAGNOSIS — M25551 Pain in right hip: Secondary | ICD-10-CM | POA: Diagnosis not present

## 2019-01-21 DIAGNOSIS — M50322 Other cervical disc degeneration at C5-C6 level: Secondary | ICD-10-CM | POA: Diagnosis not present

## 2019-01-21 DIAGNOSIS — M9901 Segmental and somatic dysfunction of cervical region: Secondary | ICD-10-CM | POA: Diagnosis not present

## 2019-01-21 DIAGNOSIS — M9905 Segmental and somatic dysfunction of pelvic region: Secondary | ICD-10-CM | POA: Diagnosis not present

## 2019-01-23 DIAGNOSIS — M9905 Segmental and somatic dysfunction of pelvic region: Secondary | ICD-10-CM | POA: Diagnosis not present

## 2019-01-23 DIAGNOSIS — M25551 Pain in right hip: Secondary | ICD-10-CM | POA: Diagnosis not present

## 2019-01-23 DIAGNOSIS — M9901 Segmental and somatic dysfunction of cervical region: Secondary | ICD-10-CM | POA: Diagnosis not present

## 2019-01-23 DIAGNOSIS — M50322 Other cervical disc degeneration at C5-C6 level: Secondary | ICD-10-CM | POA: Diagnosis not present

## 2019-01-28 DIAGNOSIS — M25551 Pain in right hip: Secondary | ICD-10-CM | POA: Diagnosis not present

## 2019-01-28 DIAGNOSIS — M9905 Segmental and somatic dysfunction of pelvic region: Secondary | ICD-10-CM | POA: Diagnosis not present

## 2019-01-28 DIAGNOSIS — M50322 Other cervical disc degeneration at C5-C6 level: Secondary | ICD-10-CM | POA: Diagnosis not present

## 2019-01-28 DIAGNOSIS — M9901 Segmental and somatic dysfunction of cervical region: Secondary | ICD-10-CM | POA: Diagnosis not present

## 2019-01-30 DIAGNOSIS — M9905 Segmental and somatic dysfunction of pelvic region: Secondary | ICD-10-CM | POA: Diagnosis not present

## 2019-01-30 DIAGNOSIS — M9901 Segmental and somatic dysfunction of cervical region: Secondary | ICD-10-CM | POA: Diagnosis not present

## 2019-01-30 DIAGNOSIS — M25551 Pain in right hip: Secondary | ICD-10-CM | POA: Diagnosis not present

## 2019-01-30 DIAGNOSIS — M50322 Other cervical disc degeneration at C5-C6 level: Secondary | ICD-10-CM | POA: Diagnosis not present

## 2020-06-06 IMAGING — US US RENAL
1 series · 14 of 25 positions shown · non-contrast
Comparison: 06/27/2017 renal sonogram.

CLINICAL DATA: Right flank pain for 2 months.  Chronic hematuria.

EXAM:
RENAL / URINARY TRACT ULTRASOUND COMPLETE

[Series 1: us renal · 0.28mm/px · 14 of 45 slices shown]
[im 1/45]
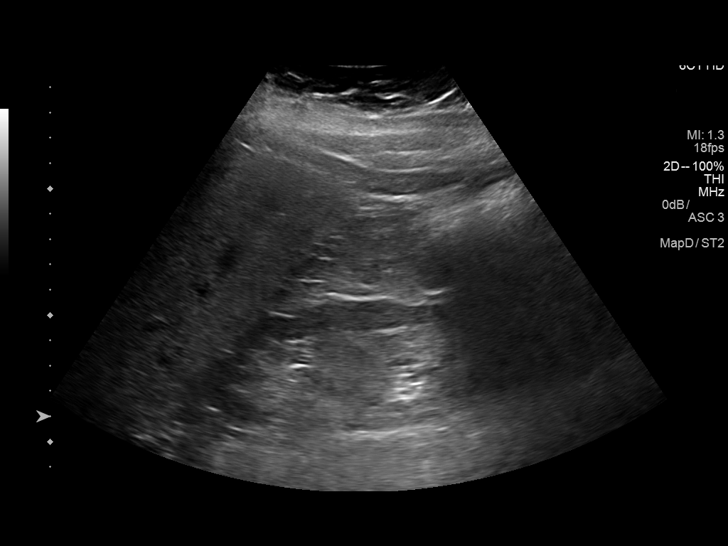
[im 4/45]
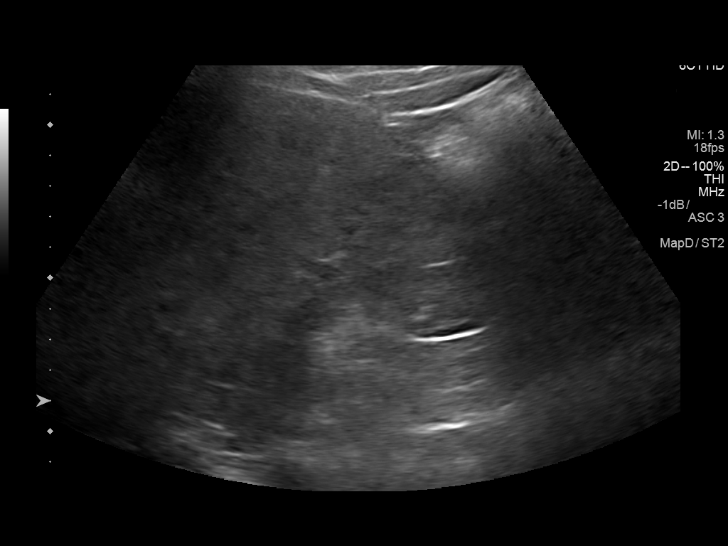
[im 8/45]
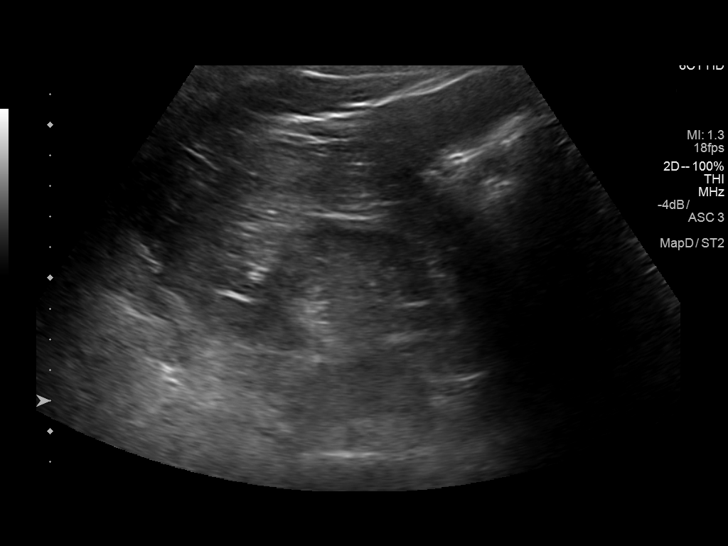
[im 12/45]
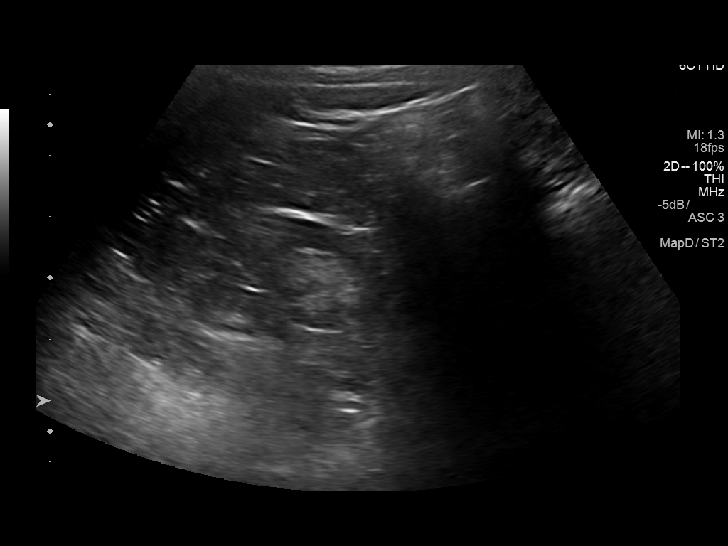
[im 15/45]
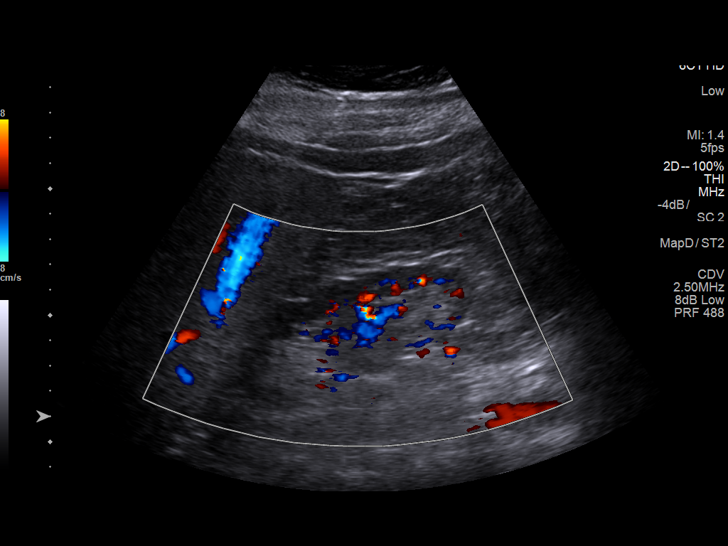
[im 17/45]
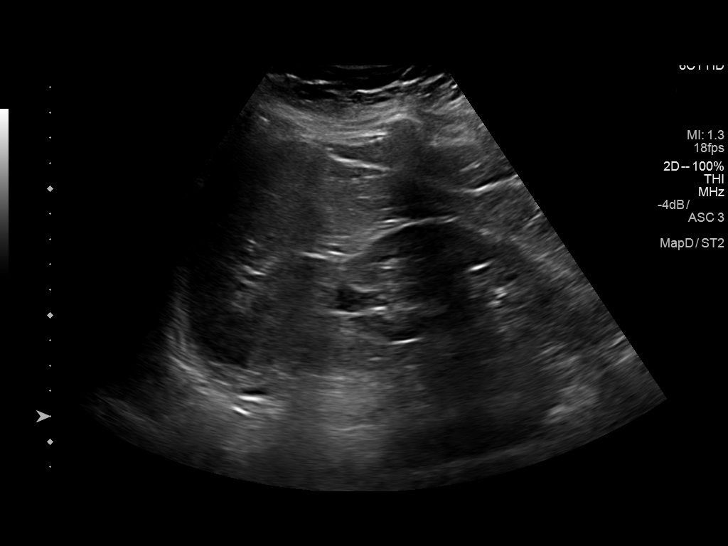
[im 21/45]
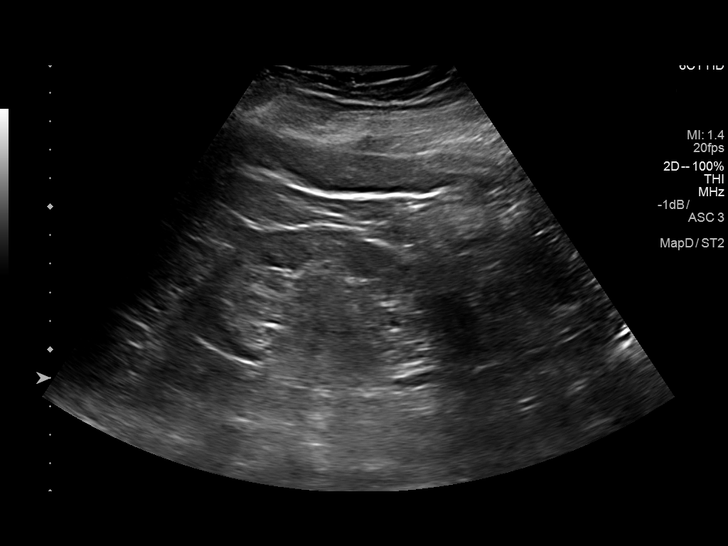
[im 24/45]
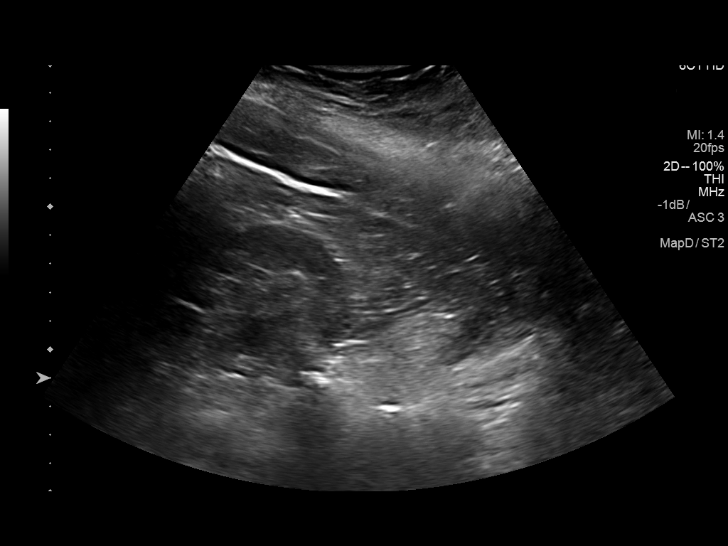
[im 28/45]
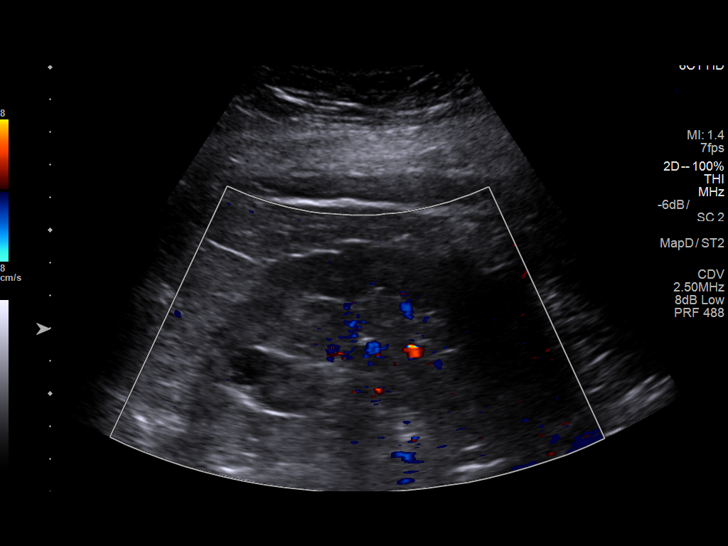
[im 30/45]
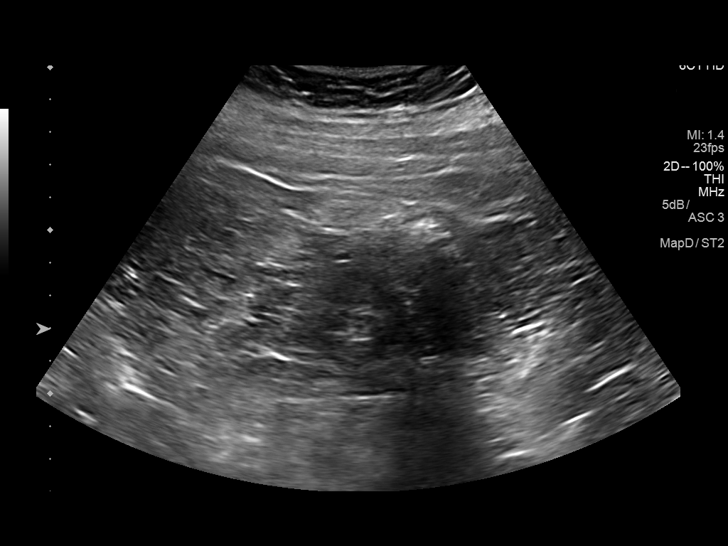
[im 34/45]
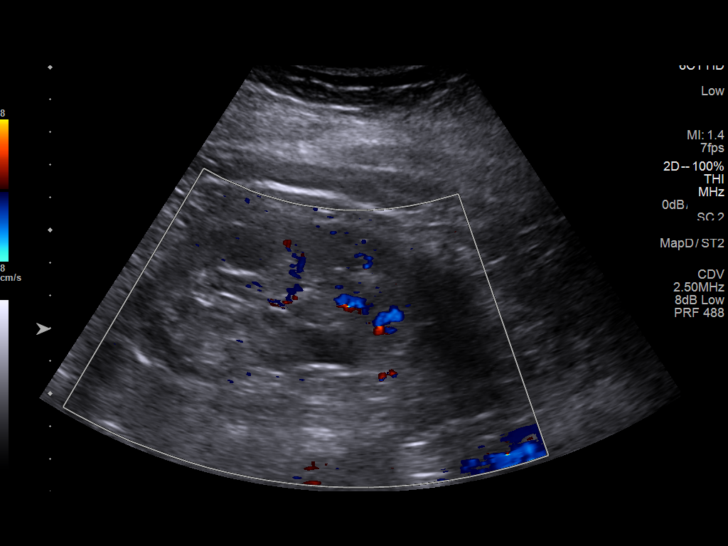
[im 37/45]
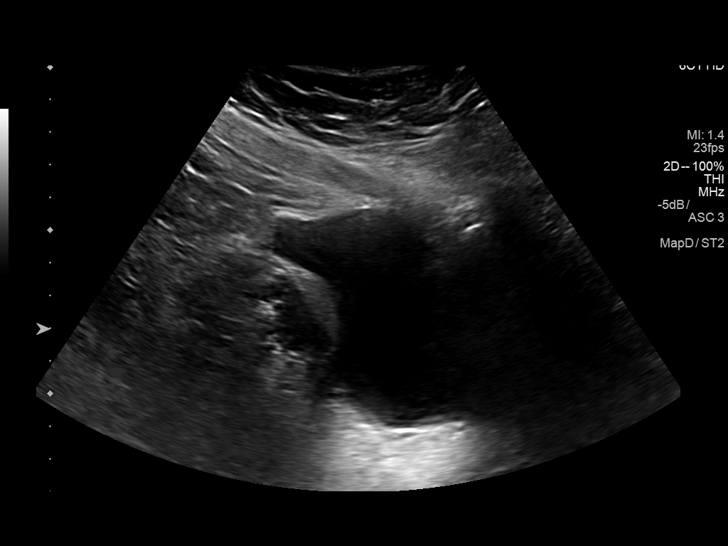
[im 41/45]
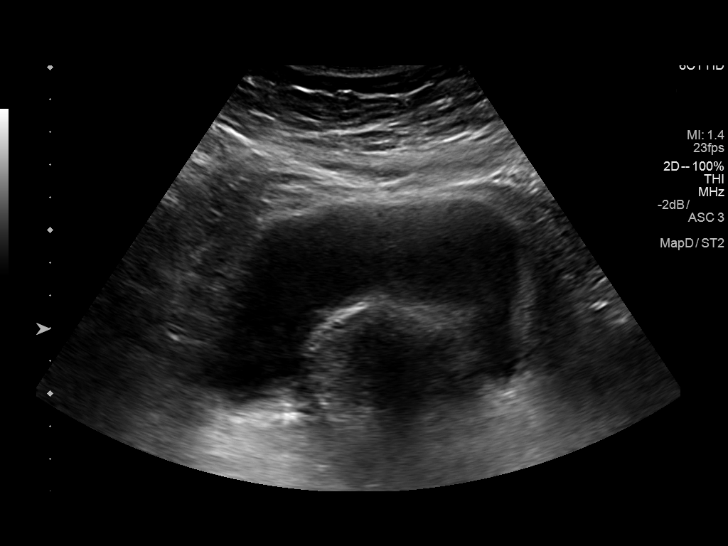
[im 45/45]
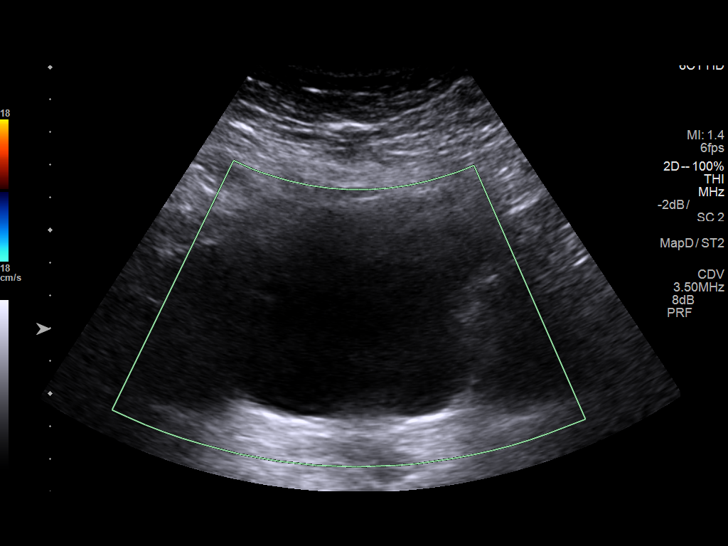

[14 of 25 positions shown; findings below may reference images not displayed]

FINDINGS: Right Kidney:

Renal measurements: 11.2 x 4.2 x 4.4 cm = volume: 109 mL. Mildly
atrophic right renal parenchyma (9 mm thickness), unchanged. No
hydronephrosis. No renal mass. Incidental echogenic right liver
parenchyma.

Left Kidney:

Renal measurements: 11.1 x 5.0 x 5.0 cm = volume: 146 mL. Renal
parenchymal thickness is low normal (10 mm). No hydronephrosis.
Simple 1.6 x 1.3 x 1.2 cm upper left renal cyst, previously 1.4 x
1.1 x 1.2 cm, minimally increased.

Bladder:

Appears normal for degree of bladder distention.
IMPRESSION: 1. No hydronephrosis.
2. Small simple upper left renal cyst.  No suspicious renal masses.
3. Asymmetrically mildly atrophic right kidney, unchanged.
4. Normal bladder.
5. Incidental echogenic right liver parenchyma, nonspecific, most
commonly due to diffuse hepatic steatosis.

## 2021-11-25 DIAGNOSIS — Z23 Encounter for immunization: Secondary | ICD-10-CM | POA: Diagnosis not present

## 2022-01-20 DIAGNOSIS — M1990 Unspecified osteoarthritis, unspecified site: Secondary | ICD-10-CM | POA: Diagnosis not present

## 2022-01-20 DIAGNOSIS — M79672 Pain in left foot: Secondary | ICD-10-CM | POA: Diagnosis not present

## 2022-01-20 DIAGNOSIS — R3121 Asymptomatic microscopic hematuria: Secondary | ICD-10-CM | POA: Diagnosis not present

## 2022-01-20 DIAGNOSIS — E039 Hypothyroidism, unspecified: Secondary | ICD-10-CM | POA: Diagnosis not present

## 2022-01-20 DIAGNOSIS — I10 Essential (primary) hypertension: Secondary | ICD-10-CM | POA: Diagnosis not present

## 2022-01-20 DIAGNOSIS — Z780 Asymptomatic menopausal state: Secondary | ICD-10-CM | POA: Diagnosis not present

## 2022-01-20 DIAGNOSIS — M109 Gout, unspecified: Secondary | ICD-10-CM | POA: Diagnosis not present

## 2022-01-20 DIAGNOSIS — N1831 Chronic kidney disease, stage 3a: Secondary | ICD-10-CM | POA: Diagnosis not present

## 2022-01-20 DIAGNOSIS — R052 Subacute cough: Secondary | ICD-10-CM | POA: Diagnosis not present

## 2022-01-20 DIAGNOSIS — M25579 Pain in unspecified ankle and joints of unspecified foot: Secondary | ICD-10-CM | POA: Diagnosis not present

## 2022-01-20 DIAGNOSIS — G8929 Other chronic pain: Secondary | ICD-10-CM | POA: Diagnosis not present

## 2022-01-27 DIAGNOSIS — H43393 Other vitreous opacities, bilateral: Secondary | ICD-10-CM | POA: Diagnosis not present

## 2022-01-27 DIAGNOSIS — H25813 Combined forms of age-related cataract, bilateral: Secondary | ICD-10-CM | POA: Diagnosis not present

## 2022-03-10 DIAGNOSIS — Z1231 Encounter for screening mammogram for malignant neoplasm of breast: Secondary | ICD-10-CM | POA: Diagnosis not present

## 2022-03-24 HISTORY — PX: CATARACT EXTRACTION: SUR2

## 2022-04-04 DIAGNOSIS — H43393 Other vitreous opacities, bilateral: Secondary | ICD-10-CM | POA: Diagnosis not present

## 2022-04-04 DIAGNOSIS — H25811 Combined forms of age-related cataract, right eye: Secondary | ICD-10-CM | POA: Diagnosis not present

## 2022-04-21 DIAGNOSIS — H268 Other specified cataract: Secondary | ICD-10-CM | POA: Diagnosis not present

## 2022-04-21 DIAGNOSIS — H2511 Age-related nuclear cataract, right eye: Secondary | ICD-10-CM | POA: Diagnosis not present

## 2022-05-03 DIAGNOSIS — H59021 Cataract (lens) fragments in eye following cataract surgery, right eye: Secondary | ICD-10-CM | POA: Diagnosis not present

## 2022-06-27 ENCOUNTER — Other Ambulatory Visit (INDEPENDENT_AMBULATORY_CARE_PROVIDER_SITE_OTHER): Payer: PPO

## 2022-06-27 ENCOUNTER — Ambulatory Visit: Payer: PPO | Admitting: Physical Medicine and Rehabilitation

## 2022-06-27 ENCOUNTER — Encounter: Payer: Self-pay | Admitting: Physical Medicine and Rehabilitation

## 2022-06-27 DIAGNOSIS — G8929 Other chronic pain: Secondary | ICD-10-CM

## 2022-06-27 DIAGNOSIS — M5442 Lumbago with sciatica, left side: Secondary | ICD-10-CM | POA: Diagnosis not present

## 2022-06-27 DIAGNOSIS — M5416 Radiculopathy, lumbar region: Secondary | ICD-10-CM | POA: Diagnosis not present

## 2022-06-27 DIAGNOSIS — M47816 Spondylosis without myelopathy or radiculopathy, lumbar region: Secondary | ICD-10-CM

## 2022-06-27 DIAGNOSIS — M4316 Spondylolisthesis, lumbar region: Secondary | ICD-10-CM | POA: Diagnosis not present

## 2022-06-27 MED ORDER — OXYCODONE-ACETAMINOPHEN 5-325 MG PO TABS: 1.0000 | ORAL_TABLET | Freq: Three times a day (TID) | ORAL | 0 refills | Status: DC | PRN

## 2022-06-27 MED ORDER — METHOCARBAMOL 500 MG PO TABS: 500.0000 mg | ORAL_TABLET | Freq: Three times a day (TID) | ORAL | 0 refills | Status: DC

## 2022-06-27 NOTE — Progress Notes (Unsigned)
Functional Pain Scale - descriptive words and definitions  Distressing (6)    Pain is present/unable to complete most ADLs limited by pain/sleep is difficult and active distraction is only marginal. Moderate range order  Average Pain  varies on position  Lower back pain on left side that radiates into the left leg to the ankle. Lying down makes pain better

## 2022-06-27 NOTE — Progress Notes (Unsigned)
Stacey Powell - 67 y.o. female MRN [id number]  Date of birth: Jan 11, 1956  Office Visit Note: Visit Date: 06/27/2022 PCP: Charlane Ferretti, DO Referred by: Charlane Ferretti, DO  Subjective: Chief Complaint  Patient presents with   Lower Back - Pain   HPI: Stacey Powell is a 67 y.o. female who comes in today as a self referral for evaluation of chronic, worsening and severe left sided lower back pain radiating to buttock, hip and down posterolateral leg to foot. Pain ongoing since December after caring for her husband. Pain worsened over the last several weeks. Patient appears to be uncomfortable during our visit today. Pain increases with sitting, prolonged standing and walking. She describes pain as shooting, burning and stabbing sensation, currently rates as 9 out of 10. Reports tingling/numbness down left leg. Some relief of pain with home exercise regimen, acupuncture, rest and use of medications. She is wearing lumbar brace today. She is currently attending formal chiropractic treatments with Dr. Meyer Russel at [facility], minimal relief of pain with these therapies. Prior lumbar x-rays at chiropractor, however I am not able to view these images. States she was told by her chiropractor that she has scoliosis and crippling arthritis. Patient reports pain is negatively impacting her daily life. Patient denies focal weakness, numbness and tingling. No recent trauma or falls.     Oswestry Disability Index Score 72% 30 to 40 (80%): very severe disability: Back pain impinges on all aspects of the patient's life. Positive intervention is required.  Review of Systems  Musculoskeletal:  Positive for back pain.  Neurological:  Positive for tingling. Negative for focal weakness and weakness.  All other systems reviewed and are negative.  Otherwise per HPI.  Assessment & Plan: Visit Diagnoses:    ICD-10-CM   1. Chronic bilateral low back pain with left-sided sciatica  M54.42 XR  Lumbar Spine 2-3 Views   G89.29 MR LUMBAR SPINE WO CONTRAST    2. Lumbar radiculopathy  M54.16 XR Lumbar Spine 2-3 Views    MR LUMBAR SPINE WO CONTRAST    3. Spondylolisthesis of lumbar region  M43.16 MR LUMBAR SPINE WO CONTRAST    4. Facet arthropathy, lumbar  M47.816 MR LUMBAR SPINE WO CONTRAST       Plan: Findings:  Chronic, worsening and severe left sided lower back pain radiating to buttock, hip and down posterolateral leg to foot. Patient continues to have severe pain despite good conservative therapies such as chiropractic treatments, home exercise regimen, rest and use of medications. Patients clinical presentation and exam are consistent with lumbar radiculopathy, more of L5 nerve pattern. I am so concerned about spinal canal stenosis as her symptoms seem to be exacerbated by prolonged standing and walking. Symptoms of neurogenic claudication present down the left leg. Her exam today is non focal, good strength noted to bilateral lower extremities, no foot drop noted. I obtained lumbar x-rays in the office today that exhibit lumbar levoscoliosis, possible pars defects and grade 2 anterolisthesis at L4-L5. Next step is to obtain lumbar MRI imaging. Depending on results of lumbar MRI imaging we discussed possibility of performing epidural steroid injection. I discussed injection procedure with her in detail today. Patient did inquire about surgical options, however we will wait and review MRI before moving forward with treatment. I discussed medication management with her today, prescribed short course of Percocet and Robaxin for her to take. She can continue with chiropractic treatments as tolerated. No red flag symptoms noted upon exam today.  Meds & Orders:  Meds ordered this encounter  Medications   oxyCODONE-acetaminophen (PERCOCET/ROXICET) 5-325 MG tablet    Sig: Take 1 tablet by mouth every 8 (eight) hours as needed for severe pain or moderate pain.    Dispense:  20 tablet     Refill:  0   methocarbamol (ROBAXIN) 500 MG tablet    Sig: Take 1 tablet (500 mg total) by mouth 3 (three) times daily.    Dispense:  90 tablet    Refill:  0    Orders Placed This Encounter  Procedures   XR Lumbar Spine 2-3 Views   MR LUMBAR SPINE WO CONTRAST    Follow-up: Return for follow up for lumbar MRI review.   Procedures: No procedures performed      Clinical History: No specialty comments available.   She reports that she quit smoking about 20 years ago. Her smoking use included cigarettes. She has never used smokeless tobacco. No results for input(s): "HGBA1C", "LABURIC" in the last 8760 hours.  Objective:  VS:  HT:    WT:   BMI:     BP:   HR: bpm  TEMP: ( )  RESP:  Physical Exam Vitals and nursing note reviewed.  HENT:     Head: Normocephalic and atraumatic.     Right Ear: External ear normal.     Left Ear: External ear normal.     Nose: Nose normal.     Mouth/Throat:     Mouth: Mucous membranes are moist.  Eyes:     Extraocular Movements: Extraocular movements intact.  Cardiovascular:     Rate and Rhythm: Normal rate.     Pulses: Normal pulses.  Pulmonary:     Effort: Pulmonary effort is normal.  Abdominal:     General: Abdomen is flat. There is no distension.  Musculoskeletal:        General: Tenderness present.     Cervical back: Normal range of motion.     Comments: Patient rises from seated position to standing without difficulty. Good lumbar range of motion. No pain noted with facet loading. 5/5 strength noted with bilateral hip flexion, knee flexion/extension, ankle dorsiflexion/plantarflexion and EHL. No clonus noted bilaterally. No pain upon palpation of greater trochanters. No pain with internal/external rotation of bilateral hips. Sensation intact bilaterally. Dysesthesias noted to left L5 nerve pattern. Negative slump test bilaterally. Ambulates without aid, gait steady.     Skin:    General: Skin is warm and dry.     Capillary Refill:  Capillary refill takes less than 2 seconds.  Neurological:     General: No focal deficit present.     Mental Status: She is alert and oriented to person, place, and time.  Psychiatric:        Mood and Affect: Mood normal.        Behavior: Behavior normal.     Ortho Exam  Imaging: No results found.  Past Medical/Family/Surgical/Social History: Medications & Allergies reviewed per EMR, new medications updated. Patient Active Problem List   Diagnosis Date Noted   Vomiting 05/25/2011   Esophageal reflux 05/25/2011   Past Medical History:  Diagnosis Date   Allergy    Arthritis    GERD (gastroesophageal reflux disease)    History of pneumonia    Hypertension    Obesity    Thyroid disease    hypothyroid   Family History  Problem Relation Age of Onset   Cancer Paternal Grandmother        gallbladder  Stomach cancer Paternal Grandmother    Lung cancer Paternal Grandfather    Colon polyps Mother    Cystic fibrosis Other        grandson   Heart disease Maternal Grandfather    Diabetes Maternal Grandfather    Colon cancer Neg Hx    Rectal cancer Neg Hx    Esophageal cancer Neg Hx    Past Surgical History:  Procedure Laterality Date   COLONOSCOPY     KNEE ARTHROSCOPY     left knee   UPPER GASTROINTESTINAL ENDOSCOPY     Social History   Occupational History   Not on file  Tobacco Use   Smoking status: Former    Types: Cigarettes    Quit date: 02/21/2002    Years since quitting: 20.3   Smokeless tobacco: Never  Substance and Sexual Activity   Alcohol use: Yes    Comment: rare    Drug use: No   Sexual activity: Yes

## 2022-06-28 ENCOUNTER — Encounter: Payer: Self-pay | Admitting: Student

## 2022-06-29 ENCOUNTER — Ambulatory Visit
Admission: RE | Admit: 2022-06-29 | Discharge: 2022-06-29 | Disposition: A | Discharge status: Home / Self Care | Payer: PPO | Source: Ambulatory Visit | Attending: Physical Medicine and Rehabilitation | Admitting: Physical Medicine and Rehabilitation

## 2022-06-29 DIAGNOSIS — M545 Low back pain, unspecified: Secondary | ICD-10-CM | POA: Diagnosis not present

## 2022-06-29 DIAGNOSIS — M79605 Pain in left leg: Secondary | ICD-10-CM | POA: Diagnosis not present

## 2022-06-29 DIAGNOSIS — G8929 Other chronic pain: Secondary | ICD-10-CM

## 2022-06-29 DIAGNOSIS — M4316 Spondylolisthesis, lumbar region: Secondary | ICD-10-CM

## 2022-06-29 DIAGNOSIS — M47816 Spondylosis without myelopathy or radiculopathy, lumbar region: Secondary | ICD-10-CM

## 2022-06-29 DIAGNOSIS — M5416 Radiculopathy, lumbar region: Secondary | ICD-10-CM

## 2022-07-04 ENCOUNTER — Telehealth: Payer: Self-pay | Admitting: Physical Medicine and Rehabilitation

## 2022-07-04 ENCOUNTER — Other Ambulatory Visit: Payer: Self-pay | Admitting: Physical Medicine and Rehabilitation

## 2022-07-04 DIAGNOSIS — M48062 Spinal stenosis, lumbar region with neurogenic claudication: Secondary | ICD-10-CM

## 2022-07-04 DIAGNOSIS — M5416 Radiculopathy, lumbar region: Secondary | ICD-10-CM

## 2022-07-04 NOTE — Telephone Encounter (Signed)
Spoke with patient this morning to discuss recent lumbar MRI imaging, there is advanced spinal canal stenosis at L4-L5. Would recommend transforaminal injection. I placed order this morning and will get her scheduled pending insurance approval. She has no questions at this time.

## 2022-07-14 ENCOUNTER — Other Ambulatory Visit: Payer: Self-pay

## 2022-07-14 ENCOUNTER — Ambulatory Visit (INDEPENDENT_AMBULATORY_CARE_PROVIDER_SITE_OTHER): Payer: PPO | Admitting: Physical Medicine and Rehabilitation

## 2022-07-14 VITALS — BP 123/73 | HR 98

## 2022-07-14 DIAGNOSIS — M5416 Radiculopathy, lumbar region: Secondary | ICD-10-CM | POA: Diagnosis not present

## 2022-07-14 MED ORDER — METHYLPREDNISOLONE ACETATE 80 MG/ML IJ SUSP
80.0000 mg | Freq: Once | INTRAMUSCULAR | Status: AC
  Administered 2022-07-14: 80 mg

## 2022-07-14 NOTE — Procedures (Signed)
Lumbosacral Transforaminal Epidural Steroid Injection - Sub-Pedicular Approach with Fluoroscopic Guidance  Patient: Stacey Powell      Date of Birth: [birth year] MRN: [id number] PCP: Charlane Ferretti, DO      Visit Date: 07/14/2022   Universal Protocol:    Date/Time: 07/14/2022  Consent Given By: the patient  Position: PRONE  Additional Comments: Vital signs were monitored before and after the procedure. Patient was prepped and draped in the usual sterile fashion. The correct patient, procedure, and site was verified.   Injection Procedure Details:   Procedure diagnoses: Lumbar radiculopathy [M54.16]    Meds Administered:  Meds ordered this encounter  Medications   methylPREDNISolone acetate (DEPO-MEDROL) injection 80 mg    Laterality: Left  Location/Site: L4  Needle:5.0 in., 22 ga.  Short bevel or Quincke spinal needle  Needle Placement: Transforaminal  Findings:    -Comments: Excellent flow of contrast along the nerve, nerve root and into the epidural space.  Procedure Details: After squaring off the end-plates to get a true AP view, the C-arm was positioned so that an oblique view of the foramen as noted above was visualized. The target area is just inferior to the "nose of the scotty dog" or sub pedicular. The soft tissues overlying this structure were infiltrated with 2-3 ml. of 1% Lidocaine without Epinephrine.  The spinal needle was inserted toward the target using a "trajectory" view along the fluoroscope beam.  Under AP and lateral visualization, the needle was advanced so it did not puncture dura and was located close the 6 O'Clock position of the pedical in AP tracterory. Biplanar projections were used to confirm position. Aspiration was confirmed to be negative for CSF and/or blood. A 1-2 ml. volume of Isovue-250 was injected and flow of contrast was noted at each level. Radiographs were obtained for documentation purposes.   After attaining the desired flow  of contrast documented above, a 0.5 to 1.0 ml test dose of 0.25% Marcaine was injected into each respective transforaminal space.  The patient was observed for 90 seconds post injection.  After no sensory deficits were reported, and normal lower extremity motor function was noted,   the above injectate was administered so that equal amounts of the injectate were placed at each foramen (level) into the transforaminal epidural space.   Additional Comments:  No complications occurred Dressing: 2 x 2 sterile gauze and Band-Aid    Post-procedure details: Patient was observed during the procedure. Post-procedure instructions were reviewed.  Patient left the clinic in stable condition.

## 2022-07-14 NOTE — Patient Instructions (Signed)

## 2022-07-14 NOTE — Progress Notes (Signed)
Stacey Powell - 67 y.o. female MRN [id number]  Date of birth: [birth year]  Office Visit Note: Visit Date: 07/14/2022 PCP: Charlane Ferretti, DO Referred by: Charlane Ferretti, DO  Subjective: Chief Complaint  Patient presents with   Lower Back - Pain   HPI:  Stacey Powell is a 66 y.o. female who comes in today at the request of Ellin Goodie, FNP for planned Left L4-5 Lumbar Transforaminal epidural steroid injection with fluoroscopic guidance.  The patient has failed conservative care including home exercise, medications, time and activity modification.  This injection will be diagnostic and hopefully therapeutic.  Please see requesting physician notes for further details and justification.  Suggest surgical consultation depending on outcome.   ROS Otherwise per HPI.  Assessment & Plan: Visit Diagnoses:    ICD-10-CM   1. Lumbar radiculopathy  M54.16 XR C-ARM NO REPORT    Epidural Steroid injection    methylPREDNISolone acetate (DEPO-MEDROL) injection 80 mg      Plan: No additional findings.   Meds & Orders:  Meds ordered this encounter  Medications   methylPREDNISolone acetate (DEPO-MEDROL) injection 80 mg    Orders Placed This Encounter  Procedures   XR C-ARM NO REPORT   Epidural Steroid injection    Follow-up: Return for visit to requesting provider as needed.   Procedures: No procedures performed  Lumbosacral Transforaminal Epidural Steroid Injection - Sub-Pedicular Approach with Fluoroscopic Guidance  Patient: Stacey Powell      Date of Birth: 1955/12/28 MRN: [id number] PCP: Charlane Ferretti, DO      Visit Date: 07/14/2022   Universal Protocol:    Date/Time: 07/14/2022  Consent Given By: the patient  Position: PRONE  Additional Comments: Vital signs were monitored before and after the procedure. Patient was prepped and draped in the usual sterile fashion. The correct patient, procedure, and site was verified.   Injection Procedure Details:    Procedure diagnoses: Lumbar radiculopathy [M54.16]    Meds Administered:  Meds ordered this encounter  Medications   methylPREDNISolone acetate (DEPO-MEDROL) injection 80 mg    Laterality: Left  Location/Site: L4  Needle:5.0 in., 22 ga.  Short bevel or Quincke spinal needle  Needle Placement: Transforaminal  Findings:    -Comments: Excellent flow of contrast along the nerve, nerve root and into the epidural space.  Procedure Details: After squaring off the end-plates to get a true AP view, the C-arm was positioned so that an oblique view of the foramen as noted above was visualized. The target area is just inferior to the "nose of the scotty dog" or sub pedicular. The soft tissues overlying this structure were infiltrated with 2-3 ml. of 1% Lidocaine without Epinephrine.  The spinal needle was inserted toward the target using a "trajectory" view along the fluoroscope beam.  Under AP and lateral visualization, the needle was advanced so it did not puncture dura and was located close the 6 O'Clock position of the pedical in AP tracterory. Biplanar projections were used to confirm position. Aspiration was confirmed to be negative for CSF and/or blood. A 1-2 ml. volume of Isovue-250 was injected and flow of contrast was noted at each level. Radiographs were obtained for documentation purposes.   After attaining the desired flow of contrast documented above, a 0.5 to 1.0 ml test dose of 0.25% Marcaine was injected into each respective transforaminal space.  The patient was observed for 90 seconds post injection.  After no sensory deficits were reported, and normal lower extremity motor function was noted,  the above injectate was administered so that equal amounts of the injectate were placed at each foramen (level) into the transforaminal epidural space.   Additional Comments:  No complications occurred Dressing: 2 x 2 sterile gauze and Band-Aid    Post-procedure details: Patient  was observed during the procedure. Post-procedure instructions were reviewed.  Patient left the clinic in stable condition.    Clinical History: CLINICAL DATA:  Low back pain with left leg pain for 2 months   EXAM: MRI LUMBAR SPINE WITHOUT CONTRAST   TECHNIQUE: Multiplanar, multisequence MR imaging of the lumbar spine was performed. No intravenous contrast was administered.   COMPARISON:  Radiography from 2 days ago.   FINDINGS: Segmentation:  5 lumbar type vertebrae   Alignment: Scoliosis and hyperlordosis with anterolisthesis at L4-5 and L5-S1   Vertebrae:  No fracture, evidence of discitis, or bone lesion.   Conus medullaris and cauda equina: Conus extends to the T12-L1 level. Conus and cauda equina appear normal.   Paraspinal and other soft tissues: Negative perispinal mass or inflammation   Disc levels:   T12- L1: Unremarkable.   L1-L2: Disc bulging with no neural compression   L2-L3: Mild disc narrowing and bulging.  Mild right facet spurring   L3-L4: Moderate degenerative facet spurring. Disc bulging and mild bilateral subarticular recess narrowing   L4-L5: Advanced facet osteoarthritis with spurring and anterolisthesis. The disc is narrowed and bulging with left foraminal and right paracentral small herniations. Advanced spinal stenosis. Left foraminal impingement with L4 root flattening   L5-S1:Degenerative facet spurring with ankylosis. No neural impingement   IMPRESSION: 1. Generalized lumbar spine degeneration with scoliosis and lower lumbar anterolisthesis. 2. Compressive spinal and left foraminal stenosis at L4-5. 3. Facet ankylosis at L5-S1.     Electronically Signed   By: Tiburcio Pea M.D.   On: 07/04/2022 06:48     Objective:  VS:  HT:    WT:   BMI:     BP:123/73  HR:98bpm  TEMP: ( )  RESP:  Physical Exam Vitals and nursing note reviewed.  Constitutional:      General: She is not in acute distress.    Appearance: Normal  appearance. She is not ill-appearing.  HENT:     Head: Normocephalic and atraumatic.     Right Ear: External ear normal.     Left Ear: External ear normal.  Eyes:     Extraocular Movements: Extraocular movements intact.  Cardiovascular:     Rate and Rhythm: Normal rate.     Pulses: Normal pulses.  Pulmonary:     Effort: Pulmonary effort is normal. No respiratory distress.  Abdominal:     General: There is no distension.     Palpations: Abdomen is soft.  Musculoskeletal:        General: Tenderness present.     Cervical back: Neck supple.     Right lower leg: No edema.     Left lower leg: No edema.     Comments: Patient has good distal strength with no pain over the greater trochanters.  No clonus or focal weakness.  Skin:    Findings: No erythema, lesion or rash.  Neurological:     General: No focal deficit present.     Mental Status: She is alert and oriented to person, place, and time.     Sensory: No sensory deficit.     Motor: No weakness or abnormal muscle tone.     Coordination: Coordination normal.  Psychiatric:  Mood and Affect: Mood normal.        Behavior: Behavior normal.      Imaging: No results found.

## 2022-07-14 NOTE — Progress Notes (Signed)
Functional Pain Scale - descriptive words and definitions  Moderate (4)   Constantly aware of pain, can complete ADLs with modification/sleep marginally affected at times/passive distraction is of no use, but active distraction gives some relief. Moderate range order  Average Pain 9   +Driver, -BT, -Dye Allergies.  Lower back pain on left side

## 2022-07-21 DIAGNOSIS — I10 Essential (primary) hypertension: Secondary | ICD-10-CM | POA: Diagnosis not present

## 2022-07-21 DIAGNOSIS — M109 Gout, unspecified: Secondary | ICD-10-CM | POA: Diagnosis not present

## 2022-07-21 DIAGNOSIS — E039 Hypothyroidism, unspecified: Secondary | ICD-10-CM | POA: Diagnosis not present

## 2022-07-21 DIAGNOSIS — R7989 Other specified abnormal findings of blood chemistry: Secondary | ICD-10-CM | POA: Diagnosis not present

## 2022-07-21 DIAGNOSIS — E785 Hyperlipidemia, unspecified: Secondary | ICD-10-CM | POA: Diagnosis not present

## 2022-07-25 ENCOUNTER — Telehealth: Payer: Self-pay | Admitting: Physical Medicine and Rehabilitation

## 2022-07-25 NOTE — Telephone Encounter (Signed)
Pt called requesting a referral be sent ASAP for appt pt has 07/28/22 at [facility]. They are asking for note and MRI. For Dr Garlon Hatchet. Pt phone number is [phone]

## 2022-07-26 NOTE — Telephone Encounter (Signed)
Notes & MRI report faxed [phone]

## 2022-07-28 DIAGNOSIS — I129 Hypertensive chronic kidney disease with stage 1 through stage 4 chronic kidney disease, or unspecified chronic kidney disease: Secondary | ICD-10-CM | POA: Diagnosis not present

## 2022-07-28 DIAGNOSIS — R7301 Impaired fasting glucose: Secondary | ICD-10-CM | POA: Diagnosis not present

## 2022-07-28 DIAGNOSIS — R82998 Other abnormal findings in urine: Secondary | ICD-10-CM | POA: Diagnosis not present

## 2022-07-28 DIAGNOSIS — M25579 Pain in unspecified ankle and joints of unspecified foot: Secondary | ICD-10-CM | POA: Diagnosis not present

## 2022-07-28 DIAGNOSIS — Z0000 Encounter for general adult medical examination without abnormal findings: Secondary | ICD-10-CM | POA: Diagnosis not present

## 2022-07-28 DIAGNOSIS — N1831 Chronic kidney disease, stage 3a: Secondary | ICD-10-CM | POA: Diagnosis not present

## 2022-07-28 DIAGNOSIS — M1990 Unspecified osteoarthritis, unspecified site: Secondary | ICD-10-CM | POA: Diagnosis not present

## 2022-07-28 DIAGNOSIS — E785 Hyperlipidemia, unspecified: Secondary | ICD-10-CM | POA: Diagnosis not present

## 2022-07-28 DIAGNOSIS — M4316 Spondylolisthesis, lumbar region: Secondary | ICD-10-CM | POA: Diagnosis not present

## 2022-08-11 DIAGNOSIS — M4316 Spondylolisthesis, lumbar region: Secondary | ICD-10-CM | POA: Diagnosis not present

## 2022-08-15 ENCOUNTER — Other Ambulatory Visit: Payer: Self-pay | Admitting: Neurosurgery

## 2022-08-22 NOTE — Pre-Procedure Instructions (Signed)
Surgical Instructions    Your procedure is scheduled on September 02, 2022.  Report to [facility] Main Entrance "A" at 7:50 A.M., then check in with the Admitting office.  Call this number if you have problems the morning of surgery:  [phone]  If you have any questions prior to your surgery date call [phone]: Open Monday-Friday 8am-4pm If you experience any cold or flu symptoms such as cough, fever, chills, shortness of breath, etc. between now and your scheduled surgery, please notify us at the above number.     Remember:  Do not eat or drink after midnight the night before your surgery      Take these medicines the morning of surgery with A SIP OF WATER:  allopurinol (ZYLOPRIM)   atorvastatin (LIPITOR)   levothyroxine (SYNTHROID, LEVOTHROID)   metoprolol succinate (TOPROL-XL)   omeprazole (PRILOSEC)     May take these medicines IF NEEDED:  HYDROcodone-acetaminophen (NORCO/VICODIN)   methocarbamol (ROBAXIN)      As of today, STOP taking any Aspirin (unless otherwise instructed by your surgeon) Aleve, Naproxen, Ibuprofen, Motrin, Advil, Goody's, BC's, all herbal medications, fish oil, and all vitamins.                     Do NOT Smoke (Tobacco/Vaping) for 24 hours prior to your procedure.  If you use a CPAP at night, you may bring your mask/headgear for your overnight stay.   Contacts, glasses, piercing's, hearing aid's, dentures or partials may not be worn into surgery, please bring cases for these belongings.    For patients admitted to the hospital, discharge time will be determined by your treatment team.   Patients discharged the day of surgery will not be allowed to drive home, and someone needs to stay with them for 24 hours.  SURGICAL WAITING ROOM VISITATION Patients having surgery or a procedure may have no more than 2 support people in the waiting area - these visitors may rotate.   Children under the age of 65 must have an adult with them who is not the  patient. If the patient needs to stay at the hospital during part of their recovery, the visitor guidelines for inpatient rooms apply. Pre-op nurse will coordinate an appropriate time for 1 support person to accompany patient in pre-op.  This support person may not rotate.   Please refer to the [facility] website for the visitor guidelines for Inpatients (after your surgery is over and you are in a regular room).   If you received a COVID test during your pre-op visit  it is requested that you wear a mask when out in public, stay away from anyone that may not be feeling well and notify your surgeon if you develop symptoms. If you have been in contact with anyone that has tested positive in the last 10 days please notify you surgeon.     Pre-operative 5 CHG Bath Instructions   You can play a key role in reducing the risk of infection after surgery. Your skin needs to be as free of germs as possible. You can reduce the number of germs on your skin by washing with CHG (chlorhexidine gluconate) soap before surgery. CHG is an antiseptic soap that kills germs and continues to kill germs even after washing.   DO NOT use if you have an allergy to chlorhexidine/CHG or antibacterial soaps. If your skin becomes reddened or irritated, stop using the CHG and notify one of our RNs at [phone].   Please  shower with the CHG soap starting 4 days before surgery using the following schedule:     Please keep in mind the following:  DO NOT shave, including legs and underarms, starting the day of your first shower.   You may shave your face at any point before/day of surgery.  Place clean sheets on your bed the day you start using CHG soap. Use a clean washcloth (not used since being washed) for each shower. DO NOT sleep with pets once you start using the CHG.   CHG Shower Instructions:  If you choose to wash your hair and private area, wash first with your normal shampoo/soap.  After you use  shampoo/soap, rinse your hair and body thoroughly to remove shampoo/soap residue.  Turn the water OFF and apply about 3 tablespoons (45 ml) of CHG soap to a CLEAN washcloth.  Apply CHG soap ONLY FROM YOUR NECK DOWN TO YOUR TOES (washing for 3-5 minutes)  DO NOT use CHG soap on face, private areas, open wounds, or sores.  Pay special attention to the area where your surgery is being performed.  If you are having back surgery, having someone wash your back for you may be helpful. Wait 2 minutes after CHG soap is applied, then you may rinse off the CHG soap.  Pat dry with a clean towel  Put on clean clothes/pajamas   If you choose to wear lotion, please use ONLY the CHG-compatible lotions on the back of this paper.     Additional instructions for the day of surgery: DO NOT APPLY any lotions, deodorants, cologne, or perfumes.   Do not wear jewelry or makeup Do not wear nail polish, gel polish, artificial nails, or any other type of covering on natural nails (fingers and toes) Do not bring valuables to the hospital. [facility] is not responsible for any belongings or valuables. Put on clean/comfortable clothes.  Brush your teeth.  Ask your nurse before applying any prescription medications to the skin.      CHG Compatible Lotions   Aveeno Moisturizing lotion  Cetaphil Moisturizing Cream  Cetaphil Moisturizing Lotion  Clairol Herbal Essence Moisturizing Lotion, Dry Skin  Clairol Herbal Essence Moisturizing Lotion, Extra Dry Skin  Clairol Herbal Essence Moisturizing Lotion, Normal Skin  Curel Age Defying Therapeutic Moisturizing Lotion with Alpha Hydroxy  Curel Extreme Care Body Lotion  Curel Soothing Hands Moisturizing Hand Lotion  Curel Therapeutic Moisturizing Cream, Fragrance-Free  Curel Therapeutic Moisturizing Lotion, Fragrance-Free  Curel Therapeutic Moisturizing Lotion, Original Formula  Eucerin Daily Replenishing Lotion  Eucerin Dry Skin Therapy Plus Alpha Hydroxy Crme   Eucerin Dry Skin Therapy Plus Alpha Hydroxy Lotion  Eucerin Original Crme  Eucerin Original Lotion  Eucerin Plus Crme Eucerin Plus Lotion  Eucerin TriLipid Replenishing Lotion  Keri Anti-Bacterial Hand Lotion  Keri Deep Conditioning Original Lotion Dry Skin Formula Softly Scented  Keri Deep Conditioning Original Lotion, Fragrance Free Sensitive Skin Formula  Keri Lotion Fast Absorbing Fragrance Free Sensitive Skin Formula  Keri Lotion Fast Absorbing Softly Scented Dry Skin Formula  Keri Original Lotion  Keri Skin Renewal Lotion Keri Silky Smooth Lotion  Keri Silky Smooth Sensitive Skin Lotion  Nivea Body Creamy Conditioning Oil  Nivea Body Extra Enriched Teacher, adult education Moisturizing Lotion Nivea Crme  Nivea Skin Firming Lotion  NutraDerm 30 Skin Lotion  NutraDerm Skin Lotion  NutraDerm Therapeutic Skin Cream  NutraDerm Therapeutic Skin Lotion  ProShield Protective Hand Cream  Provon moisturizing lotion   Please  read over the following fact sheets that you were given.

## 2022-08-23 ENCOUNTER — Encounter (HOSPITAL_COMMUNITY): Payer: Self-pay

## 2022-08-23 ENCOUNTER — Other Ambulatory Visit: Payer: Self-pay

## 2022-08-23 ENCOUNTER — Encounter (HOSPITAL_COMMUNITY)
Admission: RE | Admit: 2022-08-23 | Discharge: 2022-08-23 | Disposition: A | Discharge status: Home / Self Care | Payer: PPO | Source: Ambulatory Visit | Attending: Neurosurgery | Admitting: Neurosurgery

## 2022-08-23 VITALS — BP 121/58 | HR 73 | Temp 98.5°F | Resp 18 | Ht 67.0 in | Wt 221.2 lb

## 2022-08-23 DIAGNOSIS — I10 Essential (primary) hypertension: Secondary | ICD-10-CM | POA: Diagnosis not present

## 2022-08-23 DIAGNOSIS — Z01818 Encounter for other preprocedural examination: Secondary | ICD-10-CM | POA: Insufficient documentation

## 2022-08-23 HISTORY — DX: Hypothyroidism, unspecified: E03.9

## 2022-08-23 HISTORY — DX: Pneumonia, unspecified organism: J18.9

## 2022-08-23 LAB — BASIC METABOLIC PANEL
Anion gap: 9 (ref 5–15)
BUN: 18 mg/dL (ref 8–23)
CO2: 27 mmol/L (ref 22–32)
Calcium: 8.9 mg/dL (ref 8.9–10.3)
Chloride: 102 mmol/L (ref 98–111)
Creatinine, Ser: 1.46 mg/dL — ABNORMAL HIGH (ref 0.44–1.00)
GFR, Estimated: 40 mL/min — ABNORMAL LOW (ref >60)
Glucose, Bld: 113 mg/dL — ABNORMAL HIGH (ref 70–99)
Potassium: 3.9 mmol/L (ref 3.5–5.1)
Sodium: 138 mmol/L (ref 135–145)

## 2022-08-23 LAB — CBC
HCT: 40.4 % (ref 36.0–46.0)
Hemoglobin: 13.4 g/dL (ref 12.0–15.0)
MCH: 29.8 pg (ref 26.0–34.0)
MCHC: 33.2 g/dL (ref 30.0–36.0)
MCV: 89.8 fL (ref 80.0–100.0)
Platelets: 316 10*3/uL (ref 150–400)
RBC: 4.5 MIL/uL (ref 3.87–5.11)
RDW: 13.2 % (ref 11.5–15.5)
WBC: 8.5 10*3/uL (ref 4.0–10.5)
nRBC: 0 % (ref 0.0–0.2)

## 2022-08-23 LAB — TYPE AND SCREEN
ABO/RH(D): O POS
Antibody Screen: NEGATIVE

## 2022-08-23 LAB — SURGICAL PCR SCREEN
MRSA, PCR: NEGATIVE
Staphylococcus aureus: NEGATIVE

## 2022-08-23 NOTE — Progress Notes (Signed)
PCP - Charlane Ferretti, DO Cardiologist - Denies  PPM/ICD - Denies  Chest x-ray - Denies EKG - 08/23/2022 Stress Test - Denies ECHO - Denies Cardiac Cath - Denies  Sleep Study - Denies  DM: Denies  Blood Thinner Instructions: N/A Aspirin Instructions: N/A  ERAS Protcol - NPO PRE-SURGERY Ensure or G2- N/A  COVID TEST- No   Anesthesia review: No  Patient denies shortness of breath, fever, cough and chest pain at PAT appointment   All instructions explained to the patient, with a verbal understanding of the material. Patient agrees to go over the instructions while at home for a better understanding.The opportunity to ask questions was provided.

## 2022-08-29 DIAGNOSIS — M4316 Spondylolisthesis, lumbar region: Secondary | ICD-10-CM | POA: Diagnosis not present

## 2022-09-02 ENCOUNTER — Encounter (HOSPITAL_COMMUNITY): Payer: Self-pay | Admitting: Neurosurgery

## 2022-09-02 ENCOUNTER — Ambulatory Visit (HOSPITAL_COMMUNITY): Payer: PPO

## 2022-09-02 ENCOUNTER — Other Ambulatory Visit: Payer: Self-pay

## 2022-09-02 ENCOUNTER — Ambulatory Visit (HOSPITAL_BASED_OUTPATIENT_CLINIC_OR_DEPARTMENT_OTHER): Payer: PPO

## 2022-09-02 ENCOUNTER — Ambulatory Visit (HOSPITAL_COMMUNITY)
Admission: RE | Admit: 2022-09-02 | Discharge: 2022-09-03 | Disposition: A | Discharge status: Home / Self Care | Payer: PPO | Attending: Neurosurgery | Admitting: Neurosurgery

## 2022-09-02 ENCOUNTER — Encounter (HOSPITAL_COMMUNITY)
Admission: RE | Disposition: A | Discharge status: Home / Self Care | Payer: Self-pay | Source: Home / Self Care | Attending: Neurosurgery

## 2022-09-02 DIAGNOSIS — M4316 Spondylolisthesis, lumbar region: Secondary | ICD-10-CM | POA: Insufficient documentation

## 2022-09-02 DIAGNOSIS — Z981 Arthrodesis status: Secondary | ICD-10-CM | POA: Diagnosis not present

## 2022-09-02 DIAGNOSIS — M48061 Spinal stenosis, lumbar region without neurogenic claudication: Secondary | ICD-10-CM | POA: Diagnosis not present

## 2022-09-02 DIAGNOSIS — E039 Hypothyroidism, unspecified: Secondary | ICD-10-CM | POA: Diagnosis not present

## 2022-09-02 DIAGNOSIS — Z79899 Other long term (current) drug therapy: Secondary | ICD-10-CM | POA: Insufficient documentation

## 2022-09-02 DIAGNOSIS — Z87891 Personal history of nicotine dependence: Secondary | ICD-10-CM | POA: Diagnosis not present

## 2022-09-02 DIAGNOSIS — Z09 Encounter for follow-up examination after completed treatment for conditions other than malignant neoplasm: Secondary | ICD-10-CM | POA: Insufficient documentation

## 2022-09-02 DIAGNOSIS — K219 Gastro-esophageal reflux disease without esophagitis: Secondary | ICD-10-CM | POA: Insufficient documentation

## 2022-09-02 DIAGNOSIS — I10 Essential (primary) hypertension: Secondary | ICD-10-CM | POA: Diagnosis not present

## 2022-09-02 DIAGNOSIS — E669 Obesity, unspecified: Secondary | ICD-10-CM | POA: Insufficient documentation

## 2022-09-02 DIAGNOSIS — Z6833 Body mass index (BMI) 33.0-33.9, adult: Secondary | ICD-10-CM | POA: Insufficient documentation

## 2022-09-02 LAB — ABO/RH: ABO/RH(D): O POS

## 2022-09-02 SURGERY — POSTERIOR LUMBAR FUSION 1 LEVEL
Anesthesia: General | Site: Spine Lumbar

## 2022-09-02 MED ORDER — ONDANSETRON HCL 4 MG/2ML IJ SOLN: 4.0000 mg | Freq: Once | INTRAMUSCULAR | Status: DC | PRN

## 2022-09-02 MED ORDER — SODIUM CHLORIDE 0.9% FLUSH: 3.0000 mL | INTRAVENOUS | Status: DC | PRN

## 2022-09-02 MED ORDER — ONDANSETRON HCL 4 MG/2ML IJ SOLN
INTRAMUSCULAR | Status: DC | PRN
  Administered 2022-09-02: 4 mg via INTRAVENOUS

## 2022-09-02 MED ORDER — MENTHOL 3 MG MT LOZG
1.0000 | LOZENGE | OROMUCOSAL | Status: DC | PRN
  Administered 2022-09-02: 3 mg via ORAL
  Filled 2022-09-02: qty 9

## 2022-09-02 MED ORDER — LIDOCAINE 2% (20 MG/ML) 5 ML SYRINGE
INTRAMUSCULAR | Status: DC | PRN
  Administered 2022-09-02: 100 mg via INTRAVENOUS

## 2022-09-02 MED ORDER — PHENOL 1.4 % MT LIQD: 1.0000 | OROMUCOSAL | Status: DC | PRN

## 2022-09-02 MED ORDER — HYDROCODONE-ACETAMINOPHEN 5-325 MG PO TABS: 2.0000 | ORAL_TABLET | ORAL | Status: DC | PRN

## 2022-09-02 MED ORDER — LACTATED RINGERS IV SOLN: INTRAVENOUS | Status: DC

## 2022-09-02 MED ORDER — LIDOCAINE 2% (20 MG/ML) 5 ML SYRINGE
INTRAMUSCULAR | Status: AC
  Filled 2022-09-02: qty 5

## 2022-09-02 MED ORDER — ALLOPURINOL 100 MG PO TABS: 100.0000 mg | ORAL_TABLET | Freq: Every day | ORAL | Status: DC

## 2022-09-02 MED ORDER — FENTANYL CITRATE (PF) 250 MCG/5ML IJ SOLN
INTRAMUSCULAR | Status: DC | PRN
  Administered 2022-09-02: 100 ug via INTRAVENOUS
  Administered 2022-09-02 (×3): 50 ug via INTRAVENOUS

## 2022-09-02 MED ORDER — SODIUM CHLORIDE 0.9 % IV SOLN
250.0000 mL | INTRAVENOUS | Status: DC
  Administered 2022-09-02: 250 mL via INTRAVENOUS

## 2022-09-02 MED ORDER — CHLORHEXIDINE GLUCONATE 0.12 % MT SOLN
15.0000 mL | Freq: Once | OROMUCOSAL | Status: DC
  Filled 2022-09-02: qty 15

## 2022-09-02 MED ORDER — CEFAZOLIN SODIUM-DEXTROSE 2-3 GM-%(50ML) IV SOLR
INTRAVENOUS | Status: DC | PRN
  Administered 2022-09-02: 2 g via INTRAVENOUS

## 2022-09-02 MED ORDER — LOSARTAN POTASSIUM 50 MG PO TABS: 100.0000 mg | ORAL_TABLET | Freq: Every day | ORAL | Status: DC

## 2022-09-02 MED ORDER — CHLORHEXIDINE GLUCONATE CLOTH 2 % EX PADS: 6.0000 | MEDICATED_PAD | Freq: Once | CUTANEOUS | Status: DC

## 2022-09-02 MED ORDER — ONDANSETRON HCL 4 MG/2ML IJ SOLN: 4.0000 mg | Freq: Four times a day (QID) | INTRAMUSCULAR | Status: DC | PRN

## 2022-09-02 MED ORDER — FENTANYL CITRATE (PF) 100 MCG/2ML IJ SOLN
INTRAMUSCULAR | Status: AC
  Filled 2022-09-02: qty 2

## 2022-09-02 MED ORDER — DEXAMETHASONE SODIUM PHOSPHATE 10 MG/ML IJ SOLN
INTRAMUSCULAR | Status: AC
  Filled 2022-09-02: qty 1

## 2022-09-02 MED ORDER — LEVOTHYROXINE SODIUM 25 MCG PO TABS
125.0000 ug | ORAL_TABLET | Freq: Every day | ORAL | Status: DC
  Administered 2022-09-03: 125 ug via ORAL
  Filled 2022-09-02: qty 1

## 2022-09-02 MED ORDER — FENTANYL CITRATE (PF) 100 MCG/2ML IJ SOLN
25.0000 ug | INTRAMUSCULAR | Status: DC | PRN
  Administered 2022-09-02: 50 ug via INTRAVENOUS

## 2022-09-02 MED ORDER — KETAMINE HCL 50 MG/5ML IJ SOSY
PREFILLED_SYRINGE | INTRAMUSCULAR | Status: AC
  Filled 2022-09-02: qty 5

## 2022-09-02 MED ORDER — BUPIVACAINE LIPOSOME 1.3 % IJ SUSP
INTRAMUSCULAR | Status: AC
  Filled 2022-09-02: qty 20

## 2022-09-02 MED ORDER — SODIUM CHLORIDE 0.9% FLUSH: 3.0000 mL | Freq: Two times a day (BID) | INTRAVENOUS | Status: DC

## 2022-09-02 MED ORDER — VANCOMYCIN HCL IN DEXTROSE 1-5 GM/200ML-% IV SOLN
1000.0000 mg | INTRAVENOUS | Status: AC
  Administered 2022-09-02: 1000 mg via INTRAVENOUS
  Filled 2022-09-02: qty 200

## 2022-09-02 MED ORDER — KETAMINE HCL 10 MG/ML IJ SOLN
INTRAMUSCULAR | Status: DC | PRN
  Administered 2022-09-02: 30 mg via INTRAVENOUS

## 2022-09-02 MED ORDER — HYDROCHLOROTHIAZIDE 25 MG PO TABS: 25.0000 mg | ORAL_TABLET | Freq: Every day | ORAL | Status: DC

## 2022-09-02 MED ORDER — PHENYLEPHRINE HCL-NACL 20-0.9 MG/250ML-% IV SOLN
INTRAVENOUS | Status: DC | PRN
  Administered 2022-09-02: 40 ug/min via INTRAVENOUS

## 2022-09-02 MED ORDER — PANTOPRAZOLE SODIUM 40 MG PO TBEC: 40.0000 mg | DELAYED_RELEASE_TABLET | Freq: Every day | ORAL | Status: DC

## 2022-09-02 MED ORDER — ROCURONIUM BROMIDE 10 MG/ML (PF) SYRINGE
PREFILLED_SYRINGE | INTRAVENOUS | Status: AC
  Filled 2022-09-02: qty 10

## 2022-09-02 MED ORDER — MIDAZOLAM HCL 2 MG/2ML IJ SOLN
INTRAMUSCULAR | Status: AC
  Filled 2022-09-02: qty 2

## 2022-09-02 MED ORDER — EPHEDRINE SULFATE-NACL 50-0.9 MG/10ML-% IV SOSY
PREFILLED_SYRINGE | INTRAVENOUS | Status: DC | PRN
  Administered 2022-09-02: 5 mg via INTRAVENOUS

## 2022-09-02 MED ORDER — THROMBIN 20000 UNITS EX SOLR: CUTANEOUS | Status: DC | PRN

## 2022-09-02 MED ORDER — METOPROLOL SUCCINATE ER 100 MG PO TB24: 100.0000 mg | ORAL_TABLET | Freq: Every day | ORAL | Status: DC

## 2022-09-02 MED ORDER — 0.9 % SODIUM CHLORIDE (POUR BTL) OPTIME
TOPICAL | Status: DC | PRN
  Administered 2022-09-02: 1000 mL

## 2022-09-02 MED ORDER — SUGAMMADEX SODIUM 200 MG/2ML IV SOLN
INTRAVENOUS | Status: DC | PRN
  Administered 2022-09-02: 196 mg via INTRAVENOUS

## 2022-09-02 MED ORDER — ROCURONIUM BROMIDE 10 MG/ML (PF) SYRINGE
PREFILLED_SYRINGE | INTRAVENOUS | Status: DC | PRN
  Administered 2022-09-02: 70 mg via INTRAVENOUS
  Administered 2022-09-02: 20 mg via INTRAVENOUS
  Administered 2022-09-02 (×2): 10 mg via INTRAVENOUS

## 2022-09-02 MED ORDER — ALBUMIN HUMAN 5 % IV SOLN: INTRAVENOUS | Status: DC | PRN

## 2022-09-02 MED ORDER — ACETAMINOPHEN 650 MG RE SUPP: 650.0000 mg | RECTAL | Status: DC | PRN

## 2022-09-02 MED ORDER — THROMBIN 20000 UNITS EX SOLR
CUTANEOUS | Status: AC
  Filled 2022-09-02: qty 20000

## 2022-09-02 MED ORDER — MIDAZOLAM HCL 2 MG/2ML IJ SOLN
INTRAMUSCULAR | Status: DC | PRN
  Administered 2022-09-02 (×2): 1 mg via INTRAVENOUS

## 2022-09-02 MED ORDER — ORAL CARE MOUTH RINSE: 15.0000 mL | Freq: Once | OROMUCOSAL | Status: DC

## 2022-09-02 MED ORDER — ALUM & MAG HYDROXIDE-SIMETH 200-200-20 MG/5ML PO SUSP: 30.0000 mL | Freq: Four times a day (QID) | ORAL | Status: DC | PRN

## 2022-09-02 MED ORDER — PROPOFOL 10 MG/ML IV BOLUS
INTRAVENOUS | Status: AC
  Filled 2022-09-02: qty 20

## 2022-09-02 MED ORDER — GLYCOPYRROLATE 0.2 MG/ML IJ SOLN
INTRAMUSCULAR | Status: DC | PRN
  Administered 2022-09-02: .2 mg via INTRAVENOUS

## 2022-09-02 MED ORDER — CEFAZOLIN SODIUM-DEXTROSE 2-4 GM/100ML-% IV SOLN
2.0000 g | Freq: Three times a day (TID) | INTRAVENOUS | Status: AC
  Administered 2022-09-02 (×2): 2 g via INTRAVENOUS
  Filled 2022-09-02 (×2): qty 100

## 2022-09-02 MED ORDER — BUPIVACAINE LIPOSOME 1.3 % IJ SUSP
INTRAMUSCULAR | Status: DC | PRN
  Administered 2022-09-02: 20 mL

## 2022-09-02 MED ORDER — CYCLOBENZAPRINE HCL 10 MG PO TABS
10.0000 mg | ORAL_TABLET | Freq: Three times a day (TID) | ORAL | Status: DC | PRN
  Administered 2022-09-02 (×2): 10 mg via ORAL
  Filled 2022-09-02 (×3): qty 1

## 2022-09-02 MED ORDER — PANTOPRAZOLE SODIUM 40 MG IV SOLR: 40.0000 mg | Freq: Every day | INTRAVENOUS | Status: DC

## 2022-09-02 MED ORDER — AMISULPRIDE (ANTIEMETIC) 5 MG/2ML IV SOLN: 10.0000 mg | Freq: Once | INTRAVENOUS | Status: DC | PRN

## 2022-09-02 MED ORDER — ACETAMINOPHEN 500 MG PO TABS
1000.0000 mg | ORAL_TABLET | Freq: Once | ORAL | Status: AC
  Administered 2022-09-02: 1000 mg via ORAL
  Filled 2022-09-02: qty 2

## 2022-09-02 MED ORDER — LIDOCAINE-EPINEPHRINE 1 %-1:100000 IJ SOLN
INTRAMUSCULAR | Status: AC
  Filled 2022-09-02: qty 1

## 2022-09-02 MED ORDER — LIDOCAINE-EPINEPHRINE 1 %-1:100000 IJ SOLN
INTRAMUSCULAR | Status: DC | PRN
  Administered 2022-09-02: 10 mL

## 2022-09-02 MED ORDER — HYDROMORPHONE HCL 1 MG/ML IJ SOLN
0.5000 mg | INTRAMUSCULAR | Status: DC | PRN
  Filled 2022-09-02: qty 0.5

## 2022-09-02 MED ORDER — ONDANSETRON HCL 4 MG PO TABS: 4.0000 mg | ORAL_TABLET | Freq: Four times a day (QID) | ORAL | Status: DC | PRN

## 2022-09-02 MED ORDER — ACETAMINOPHEN 325 MG PO TABS: 650.0000 mg | ORAL_TABLET | ORAL | Status: DC | PRN

## 2022-09-02 MED ORDER — PROPOFOL 10 MG/ML IV BOLUS
INTRAVENOUS | Status: DC | PRN
Start: 2022-09-02 — End: 2022-09-02
  Administered 2022-09-02: 150 mg via INTRAVENOUS

## 2022-09-02 MED ORDER — OXYCODONE-ACETAMINOPHEN 5-325 MG PO TABS: 1.0000 | ORAL_TABLET | Freq: Three times a day (TID) | ORAL | Status: DC | PRN

## 2022-09-02 MED ORDER — DEXAMETHASONE SODIUM PHOSPHATE 10 MG/ML IJ SOLN
INTRAMUSCULAR | Status: DC | PRN
  Administered 2022-09-02: 8 mg via INTRAVENOUS

## 2022-09-02 MED ORDER — FENTANYL CITRATE (PF) 250 MCG/5ML IJ SOLN
INTRAMUSCULAR | Status: AC
  Filled 2022-09-02: qty 5

## 2022-09-02 MED ORDER — ONDANSETRON HCL 4 MG/2ML IJ SOLN
INTRAMUSCULAR | Status: AC
  Filled 2022-09-02: qty 2

## 2022-09-02 MED ORDER — PHENYLEPHRINE 80 MCG/ML (10ML) SYRINGE FOR IV PUSH (FOR BLOOD PRESSURE SUPPORT)
PREFILLED_SYRINGE | INTRAVENOUS | Status: DC | PRN
  Administered 2022-09-02: 80 ug via INTRAVENOUS
  Administered 2022-09-02: 120 ug via INTRAVENOUS

## 2022-09-02 MED ORDER — ATORVASTATIN CALCIUM 10 MG PO TABS: 20.0000 mg | ORAL_TABLET | Freq: Every day | ORAL | Status: DC

## 2022-09-02 MED ORDER — METHOCARBAMOL 500 MG PO TABS: 500.0000 mg | ORAL_TABLET | Freq: Four times a day (QID) | ORAL | Status: DC | PRN

## 2022-09-02 MED ORDER — OXYCODONE-ACETAMINOPHEN 5-325 MG PO TABS
1.0000 | ORAL_TABLET | ORAL | Status: DC | PRN
  Administered 2022-09-02 – 2022-09-03 (×5): 2 via ORAL
  Filled 2022-09-02 (×5): qty 2

## 2022-09-02 SURGICAL SUPPLY — 68 items
ADH SKN CLS APL DERMABOND .7 (GAUZE/BANDAGES/DRESSINGS) ×1
APL SKNCLS STERI-STRIP NONHPOA (GAUZE/BANDAGES/DRESSINGS) ×1
BAG COUNTER SPONGE SURGICOUNT (BAG) ×1 IMPLANT
BAG SPNG CNTER NS LX DISP (BAG) ×1
BASKET BONE COLLECTION (BASKET) ×1 IMPLANT
BENZOIN TINCTURE PRP APPL 2/3 (GAUZE/BANDAGES/DRESSINGS) ×1 IMPLANT
BLADE BONE MILL MEDIUM (MISCELLANEOUS) ×1 IMPLANT
BLADE SURG 11 STRL SS (BLADE) ×1 IMPLANT
BONE VIVIGEN FORMABLE 5.4CC (Bone Implant) ×1 IMPLANT
BUR CUTTER 7.0 ROUND (BURR) ×1 IMPLANT
BUR MATCHSTICK NEURO 3.0 LAGG (BURR) ×1 IMPLANT
CANISTER SUCT 3000ML PPV (MISCELLANEOUS) ×1 IMPLANT
CAP RELINE MOD TULIP RMM (Cap) IMPLANT
CNTNR URN SCR LID CUP LEK RST (MISCELLANEOUS) ×1 IMPLANT
CONT SPEC 4OZ STRL OR WHT (MISCELLANEOUS) ×1
COVER BACK TABLE 60X90IN (DRAPES) ×1 IMPLANT
DERMABOND ADVANCED .7 DNX12 (GAUZE/BANDAGES/DRESSINGS) ×1 IMPLANT
DRAPE C-ARM 42X72 X-RAY (DRAPES) ×2 IMPLANT
DRAPE HALF SHEET 40X57 (DRAPES) IMPLANT
DRAPE LAPAROTOMY 100X72X124 (DRAPES) ×1 IMPLANT
DRAPE SURG 17X23 STRL (DRAPES) ×1 IMPLANT
DRSG OPSITE 4X5.5 SM (GAUZE/BANDAGES/DRESSINGS) ×1 IMPLANT
DRSG OPSITE POSTOP 4X6 (GAUZE/BANDAGES/DRESSINGS) ×1 IMPLANT
DURAPREP 26ML APPLICATOR (WOUND CARE) ×1 IMPLANT
ELECT REM PT RETURN 9FT ADLT (ELECTROSURGICAL) ×1
ELECTRODE REM PT RTRN 9FT ADLT (ELECTROSURGICAL) ×1 IMPLANT
EVACUATOR 3/16 PVC DRAIN (DRAIN) IMPLANT
GAUZE 4X4 16PLY ~~LOC~~+RFID DBL (SPONGE) IMPLANT
GAUZE SPONGE 4X4 12PLY STRL (GAUZE/BANDAGES/DRESSINGS) ×1 IMPLANT
GLOVE BIO SURGEON STRL SZ7 (GLOVE) IMPLANT
GLOVE BIO SURGEON STRL SZ8 (GLOVE) ×2 IMPLANT
GLOVE BIOGEL PI IND STRL 7.0 (GLOVE) IMPLANT
GLOVE EXAM NITRILE XL STR (GLOVE) IMPLANT
GLOVE INDICATOR 8.5 STRL (GLOVE) ×2 IMPLANT
GOWN STRL REUS W/ TWL LRG LVL3 (GOWN DISPOSABLE) IMPLANT
GOWN STRL REUS W/ TWL XL LVL3 (GOWN DISPOSABLE) ×2 IMPLANT
GOWN STRL REUS W/TWL 2XL LVL3 (GOWN DISPOSABLE) IMPLANT
GOWN STRL REUS W/TWL LRG LVL3 (GOWN DISPOSABLE) ×2
GOWN STRL REUS W/TWL XL LVL3 (GOWN DISPOSABLE) ×3
GRAFT BNE MATRIX VG FRMBL MD 5 (Bone Implant) IMPLANT
GRAFT BONE PROTEIOS LRG 5CC (Orthopedic Implant) IMPLANT
KIT BASIN OR (CUSTOM PROCEDURE TRAY) ×1 IMPLANT
KIT TURNOVER KIT B (KITS) ×1 IMPLANT
MARKER SKIN DUAL TIP RULER LAB (MISCELLANEOUS) IMPLANT
NDL HYPO 21X1.5 SAFETY (NEEDLE) IMPLANT
NDL HYPO 25X1 1.5 SAFETY (NEEDLE) ×1 IMPLANT
NEEDLE HYPO 21X1.5 SAFETY (NEEDLE) ×1 IMPLANT
NEEDLE HYPO 25X1 1.5 SAFETY (NEEDLE) ×1 IMPLANT
NS IRRIG 1000ML POUR BTL (IV SOLUTION) ×1 IMPLANT
PACK LAMINECTOMY NEURO (CUSTOM PROCEDURE TRAY) ×1 IMPLANT
PAD ARMBOARD 7.5X6 YLW CONV (MISCELLANEOUS) ×3 IMPLANT
ROD RELINE COCR LORD 5X40MM (Rod) IMPLANT
SCREW LOCK RSS 4.5/5.0MM (Screw) IMPLANT
SCREW SHANK RELINE MOD 5.0X30 (Screw) IMPLANT
SCREW SHANK RELINE MOD 6.5X30 (Screw) IMPLANT
SPACER SUSTAIN TI 9X22X12 8D (Spacer) IMPLANT
SPONGE SURGIFOAM ABS GEL 100 (HEMOSTASIS) ×1 IMPLANT
SPONGE T-LAP 4X18 ~~LOC~~+RFID (SPONGE) IMPLANT
STRIP CLOSURE SKIN 1/2X4 (GAUZE/BANDAGES/DRESSINGS) ×2 IMPLANT
SUT VIC AB 0 CT1 18XCR BRD8 (SUTURE) ×2 IMPLANT
SUT VIC AB 0 CT1 8-18 (SUTURE) ×1
SUT VIC AB 2-0 CT1 18 (SUTURE) ×1 IMPLANT
SUT VIC AB 4-0 PS2 27 (SUTURE) ×1 IMPLANT
SYR 20CC LL (SYRINGE) IMPLANT
TOWEL GREEN STERILE (TOWEL DISPOSABLE) ×1 IMPLANT
TOWEL GREEN STERILE FF (TOWEL DISPOSABLE) ×1 IMPLANT
TRAY FOLEY MTR SLVR 16FR STAT (SET/KITS/TRAYS/PACK) ×1 IMPLANT
WATER STERILE IRR 1000ML POUR (IV SOLUTION) ×1 IMPLANT

## 2022-09-02 NOTE — Anesthesia Procedure Notes (Signed)
Procedure Name: Intubation Date/Time: 09/02/2022 10:06 AM  Performed by: Carolynne Edouard, RNPre-anesthesia Checklist: Patient identified, Emergency Drugs available, Suction available and Patient being monitored Patient Re-evaluated:Patient Re-evaluated prior to induction Oxygen Delivery Method: Circle system utilized Preoxygenation: Pre-oxygenation with 100% oxygen Induction Type: IV induction Ventilation: Mask ventilation without difficulty Laryngoscope Size: Mac and 4 Grade View: Grade I Tube type: Oral Tube size: 7.0 mm Number of attempts: 1 Airway Equipment and Method: Stylet and Oral airway Placement Confirmation: ETT inserted through vocal cords under direct vision, positive ETCO2 and breath sounds checked- equal and bilateral Secured at: 21 cm Tube secured with: Tape Dental Injury: Teeth and Oropharynx as per pre-operative assessment

## 2022-09-02 NOTE — Anesthesia Preprocedure Evaluation (Addendum)
Anesthesia Evaluation  Patient identified by MRN, date of birth, ID band Patient awake    Reviewed: Allergy & Precautions, NPO status , Patient's Chart, lab work & pertinent test results, reviewed documented beta blocker date and time   Airway Mallampati: II  TM Distance: >3 FB Neck ROM: Full    Dental  (+) Dental Advisory Given, Upper Dentures, Partial Lower   Pulmonary former smoker   Pulmonary exam normal breath sounds clear to auscultation       Cardiovascular hypertension, Pt. on medications and Pt. on home beta blockers Normal cardiovascular exam Rhythm:Regular Rate:Normal     Neuro/Psych Spondylolisthesis  negative psych ROS   GI/Hepatic Neg liver ROS,GERD  Medicated,,  Endo/Other  Hypothyroidism  Obesity   Renal/GU negative Renal ROS     Musculoskeletal  (+) Arthritis ,    Abdominal   Peds  Hematology negative hematology ROS (+)   Anesthesia Other Findings Day of surgery medications reviewed with the patient.  Reproductive/Obstetrics                             Anesthesia Physical Anesthesia Plan  ASA: 2  Anesthesia Plan: General   Post-op Pain Management: Tylenol PO (pre-op)* and Ketamine IV*   Induction: Intravenous  PONV Risk Score and Plan: 3 and Midazolam, Dexamethasone and Ondansetron  Airway Management Planned: Oral ETT  Additional Equipment:   Intra-op Plan:   Post-operative Plan: Extubation in OR  Informed Consent: I have reviewed the patients History and Physical, chart, labs and discussed the procedure including the risks, benefits and alternatives for the proposed anesthesia with the patient or authorized representative who has indicated his/her understanding and acceptance.     Dental advisory given  Plan Discussed with: CRNA  Anesthesia Plan Comments:        Anesthesia Quick Evaluation

## 2022-09-02 NOTE — Op Note (Signed)
Preoperative diagnosis: Grade 1 spondylolisthesis severe spinal stenosis L4-5.  Postoperative diagnosis: Same.  Procedure: #1 complete decompressive lumbar laminectomy with Gill decompression complete medial facetectomies radical foraminotomies of the L4 and L5 nerve roots bilaterally in excess and requiring more work than would be needed with a standard interbody fusion.  2.  Posterior lumbar interbody fusion utilizing the globus insert and rotate titanium cages packed with locally harvested autograft mixed with Vivigen and Proteus.  3.  Cortical screw fixation L4-5 utilizing the NuVasive cortical screw set.  4.  Reduction spinal deformity L4-5.  Surgeon: Donalee Citrin.  Assistant: Julien Girt.  Anesthesia: General.  EBL: Minimal.  HPI: 67 year old female progressive worsening back bilateral hip and leg pain consistent with L4-L5 nerve root pattern.  Workup revealed a dynamic grade 1 spondylolisthesis at L4-5 with severe spinal stenosis at that level.  Due to patient's progression of clinical syndrome imaging findings of a conservative treatment I recommended decompressive laminectomy interbody fusion at L4-5.  I extensively reviewed the risks and benefits of the operation with her as well as perioperative course expectations of outcome and alternatives of surgery and she understood and agreed to proceed forward.  Operative procedure: Patient was brought into the OR was induced under general anesthesia positioned prone the Wilson frame her back was prepped and draped in routine sterile fashion.  Preoperative x-ray localized the appropriate level so after infiltration of 10 cc lidocaine with epi midline incision was made and Bovie electrocautery was used to take down the subcutaneous tissue and subperiosteal dissection was carried lamina of L4 and L5 confirmed by interoperative x-ray at this point the facet joints were drilled down with a high-speed drill capturing the bone shavings and mucus  trap the spinous process was removed the central lamina was also thinned out drilled down and then central decompression was begun with a 3 Miller Kerrison punch.  Marked facet arthropathy was causing severe hourglass compression of thecal sac I eventually blew through the pars drilled off the inferomedial aspect of the pedicle at L4 performed foraminotomies of the L4 nerve roots and aggressively performed complete medial facetectomies decompressing the central canal and gaining access to the lateral margin of disc base.  Then performed foraminotomies the L5 nerve roots bilaterally then incised the disc base bilaterally under fluoroscopy with sequential distraction and selected a 9-12 insert and rotate titanium cage better.  The cages were packed with locally harvested autograft mixed with Vivigen and Proteus and they were inserted bilaterally with an extensive mount of autograft mix centrally.  Attention was then taken to cortical screw placement this was all done under fluoroscopy all screws had excellent purchase the right L5 screw did strip on tightening down so I replaced it with a screw 2 sizes larger.  This screw had excellent purchase then everything was anchored in place the foramina were reinspected to confirm patency and no migration of graft material Gelfoam was overlaid top of the dura and the muscle and fascia reapproximated layers with active Vicryl skin was closed running 4 subcuticular Dermabond benzoin Steri-Strips and a sterile dressing was applied patient recovery in stable condition.  At the end the case all needle counts and sponge counts were correct.

## 2022-09-02 NOTE — Anesthesia Postprocedure Evaluation (Signed)
Anesthesia Post Note  Patient: Stacey Powell  Procedure(s) Performed: LUMBAR FOUR-FIVE POSTERIOR INTERBODY FUSION (Spine Lumbar)     Patient location during evaluation: PACU Anesthesia Type: General Level of consciousness: awake and alert Pain management: pain level controlled Vital Signs Assessment: post-procedure vital signs reviewed and stable Respiratory status: spontaneous breathing, nonlabored ventilation, respiratory function stable and patient connected to nasal cannula oxygen Cardiovascular status: blood pressure returned to baseline and stable Postop Assessment: no apparent nausea or vomiting Anesthetic complications: no   No notable events documented.  Last Vitals:  Vitals:   09/02/22 1400 09/02/22 1426  BP: (!) 119/55 (!) 123/95  Pulse: 68 71  Resp: 12 18  Temp:  36.6 C  SpO2: 94% 96%    Last Pain:  Vitals:   09/02/22 1426  TempSrc: Oral  PainSc: 6                  Collene Schlichter

## 2022-09-02 NOTE — Transfer of Care (Signed)
Immediate Anesthesia Transfer of Care Note  Patient: Stacey Powell  Procedure(s) Performed: LUMBAR FOUR-FIVE POSTERIOR INTERBODY FUSION (Spine Lumbar)  Patient Location: PACU  Anesthesia Type:General  Level of Consciousness: awake, alert , and oriented  Airway & Oxygen Therapy: Patient Spontanous Breathing and Patient connected to face mask oxygen  Post-op Assessment: Report given to RN and Post -op Vital signs reviewed and stable  Post vital signs: Reviewed and stable  Last Vitals:  Vitals Value Taken Time  BP 128/57 09/02/22 1318  Temp 36.4 C 09/02/22 1315  Pulse 75 09/02/22 1323  Resp 16 09/02/22 1323  SpO2 98 % 09/02/22 1323  Vitals shown include unfiled device data.  Last Pain:  Vitals:   09/02/22 1315  TempSrc:   PainSc: Asleep         Complications: No notable events documented.

## 2022-09-02 NOTE — H&P (Signed)
Stacey Powell is an 67 y.o. female.   Chief Complaint: Back right greater than left hip and leg pain HPI: 67 year old female progressive worsening back bilateral hip and leg pain worse on the right workup revealed a grade 1 spondylolisthesis at L4-5 with severe spinal stenosis at that level.  Due to patient's progression of clinical syndrome imaging findings of a conservative treatment I recommended decompressive laminectomy and interbody fusion at that level I extensively reviewed the risks and benefits of the operation with the patient as well as perioperative course expectations of outcome and alternatives to surgery and she understood and agreed to proceed forward.  Past Medical History:  Diagnosis Date   Allergy    Arthritis    GERD (gastroesophageal reflux disease)    History of pneumonia    Hypertension    Hypothyroidism    Obesity    Pneumonia    about 67 years old   Thyroid disease    hypothyroid    Past Surgical History:  Procedure Laterality Date   CATARACT EXTRACTION Right 03/2022   COLONOSCOPY     KNEE ARTHROSCOPY     left knee   UPPER GASTROINTESTINAL ENDOSCOPY      Family History  Problem Relation Age of Onset   Cancer Paternal Grandmother        gallbladder    Stomach cancer Paternal Grandmother    Lung cancer Paternal Grandfather    Colon polyps Mother    Cystic fibrosis Other        grandson   Heart disease Maternal Grandfather    Diabetes Maternal Grandfather    Colon cancer Neg Hx    Rectal cancer Neg Hx    Esophageal cancer Neg Hx    Social History:  reports that she quit smoking about 20 years ago. Her smoking use included cigarettes. She has never used smokeless tobacco. She reports current alcohol use. She reports that she does not use drugs.  Allergies:  Allergies  Allergen Reactions   Penicillins     Unknown reaction    Facility-Administered Medications Prior to Admission  Medication Dose Route Frequency Provider Last Rate Last Admin    0.9 %  sodium chloride infusion  500 mL Intravenous Once Sherrilyn Rist, MD       Medications Prior to Admission  Medication Sig Dispense Refill   allopurinol (ZYLOPRIM) 100 MG tablet Take 100 mg by mouth daily.     atorvastatin (LIPITOR) 20 MG tablet Take 20 mg by mouth daily.     hydrochlorothiazide (HYDRODIURIL) 25 MG tablet Take 25 mg by mouth daily.  4   HYDROcodone-acetaminophen (NORCO/VICODIN) 5-325 MG tablet Take 1 tablet by mouth every 6 (six) hours as needed for moderate pain.     levothyroxine (SYNTHROID, LEVOTHROID) 125 MCG tablet Take 125 mcg by mouth daily before breakfast.  1   losartan (COZAAR) 100 MG tablet Take 100 mg by mouth daily.  2   methocarbamol (ROBAXIN) 500 MG tablet Take 1 tablet (500 mg total) by mouth 3 (three) times daily. (Patient taking differently: Take 500 mg by mouth every 8 (eight) hours as needed for muscle spasms.) 90 tablet 0   metoprolol succinate (TOPROL-XL) 100 MG 24 hr tablet Take 100 mg by mouth daily.  4   omeprazole (PRILOSEC) 20 MG capsule Take 20 mg by mouth daily.  5   oxyCODONE-acetaminophen (PERCOCET/ROXICET) 5-325 MG tablet Take 1 tablet by mouth every 8 (eight) hours as needed for severe pain or moderate pain. 20  tablet 0    No results found for this or any previous visit (from the past 48 hour(s)). No results found.  Review of Systems  Musculoskeletal:  Positive for back pain.  Neurological:  Positive for numbness.    Blood pressure (!) 128/53, pulse 74, temperature 98.4 F (36.9 C), temperature source Oral, resp. rate 17, height 5\' 7"  (1.702 m), weight 98 kg, SpO2 99%. Physical Exam HENT:     Head: Normocephalic.     Right Ear: Tympanic membrane normal.     Nose: Nose normal.     Mouth/Throat:     Mouth: Mucous membranes are moist.  Eyes:     Pupils: Pupils are equal, round, and reactive to light.  Cardiovascular:     Rate and Rhythm: Normal rate.  Pulmonary:     Effort: Pulmonary effort is normal.  Abdominal:      General: Abdomen is flat.  Musculoskeletal:        General: Normal range of motion.     Cervical back: Normal range of motion.  Neurological:     Mental Status: She is alert.     Comments: Strength is 5 out of 5 iliopsoas, quads, hamstrings, gastroc, into tibialis, and EHL.      Assessment/Plan 67 year old presents for decompressive laminectomy interbody fusion L4-5  Mariam Dollar, MD 09/02/2022, 9:25 AM

## 2022-09-03 DIAGNOSIS — R5082 Postprocedural fever: Secondary | ICD-10-CM | POA: Diagnosis not present

## 2022-09-03 DIAGNOSIS — K5730 Diverticulosis of large intestine without perforation or abscess without bleeding: Secondary | ICD-10-CM | POA: Diagnosis not present

## 2022-09-03 DIAGNOSIS — N179 Acute kidney failure, unspecified: Secondary | ICD-10-CM | POA: Diagnosis not present

## 2022-09-03 DIAGNOSIS — K769 Liver disease, unspecified: Secondary | ICD-10-CM | POA: Diagnosis not present

## 2022-09-03 DIAGNOSIS — R509 Fever, unspecified: Secondary | ICD-10-CM | POA: Diagnosis not present

## 2022-09-03 DIAGNOSIS — R109 Unspecified abdominal pain: Secondary | ICD-10-CM | POA: Diagnosis not present

## 2022-09-03 DIAGNOSIS — K449 Diaphragmatic hernia without obstruction or gangrene: Secondary | ICD-10-CM | POA: Diagnosis not present

## 2022-09-03 MED ORDER — METHOCARBAMOL 500 MG PO TABS: 500.0000 mg | ORAL_TABLET | Freq: Four times a day (QID) | ORAL | 0 refills | Status: AC | PRN

## 2022-09-03 MED ORDER — OXYCODONE-ACETAMINOPHEN 10-325 MG PO TABS: 1.0000 | ORAL_TABLET | ORAL | 0 refills | Status: AC | PRN

## 2022-09-03 NOTE — Progress Notes (Signed)
Neurosurgery Service Progress Note  Subjective: No acute events overnight. Reports complete resolution of pre-op LE radicular pain. Back pain as expected. Overall, very happy and pleased with her outcomes.    Objective: Vitals:   09/02/22 2009 09/02/22 2306 09/03/22 0348 09/03/22 0720  BP: (!) 131/51 (!) 128/55 (!) 128/52 (!) 125/59  Pulse: 91 88 69 78  Resp: 20 20 20 18   Temp: 98.4 F (36.9 C) 98.3 F (36.8 C) 98.5 F (36.9 C) 98.2 F (36.8 C)  TempSrc: Oral Oral Oral Oral  SpO2: 99% 96% 98% 100%  Weight:      Height:        Physical Exam: Strength 5/5 x4 and SILTx4, incision c/d/I   Assessment & Plan: 67 y.o. female s/p L4-5 lami / PLIF / PSF, recovering well.  -PT/OT -discharge home today   Stacey Hero, PA-C 09/03/22 8:49 AM

## 2022-09-03 NOTE — Progress Notes (Signed)
Patient alert and oriented, ambulate, void. Surgical site clean and dry. D/c instructions explain and given to the patient.

## 2022-09-03 NOTE — Discharge Summary (Signed)
Discharge Summary  Date of Admission: 09/02/2022  Date of Discharge: 09/03/22  Attending Physician: Donalee Citrin, MD  Hospital Course: Patient was admitted following an uncomplicated L4-5 laminectomy and PLIF / PSF. They were recovered in PACU and transferred to [facility]. Their preop symptoms were improved, their hospital course was uncomplicated and the patient was discharged home today. They will follow up in clinic with Dr. Wynetta Emery in clinic in 2 weeks.  Neurologic exam at discharge:  Strength 5/5 x4 and SILTx4, incision c/d/I   Discharge diagnosis: spondylolisthesis   Iran Sizer, PA-C 09/03/22 8:51 AM

## 2022-09-03 NOTE — Progress Notes (Signed)
PT Cancellation Note  Patient Details Name: Stacey Powell MRN: [id number] DOB: May 01, 1955   Cancelled Treatment:    Reason Eval/Treat Not Completed: PT screened, no needs identified, will sign off - mobilizing independently, OT already saw for D/C, pt and OT state pt with no acute PT needs. PT to sign off  Marye Round, PT DPT Acute Rehabilitation Services Secure Chat Preferred  Office [phone]   Truddie Coco 09/03/2022, 9:56 AM

## 2022-09-03 NOTE — Evaluation (Signed)
Occupational Therapy Evaluation and Discharge Patient Details Name: Stacey Powell MRN: [id number] DOB: 1955/05/22 Today's Date: 09/03/2022   History of Present Illness Pt is a 8 u/o female presented 09/02/22 for planned decompressive laminectomy and interbody fusion of L4-5 due to worsening back bilateral hip and leg pain worse on the right. PMH includes:  has a past medical history of Allergy, Arthritis, Hypertension, Hypothyroidism, Obesity, Pneumonia.   Clinical Impression   Pt is typically independent in ADL and mobility. Today she reports that her leg pain has improved dramatically. Pt educated on compensatory strategies for ADL for UB and LB to maintain back precautions. Pt able to demonstrate toilet transfer, peri care, sink level grooming, LB dressing, don/doff of brace, transfers and ambulation, flight of stairs at supervision level. She was able to repeat education and back precautions at end of session and maintained them well throughout functional tasks. Pt and son with no questions at the end of session, education complete and OT will sign off at this time.       Recommendations for follow up therapy are one component of a multi-disciplinary discharge planning process, led by the attending physician.  Recommendations may be updated based on patient status, additional functional criteria and insurance authorization.   Assistance Recommended at Discharge PRN  Patient can return home with the following Assistance with cooking/housework;Assist for transportation    Functional Status Assessment  Patient has had a recent decline in their functional status and demonstrates the ability to make significant improvements in function in a reasonable and predictable amount of time.  Equipment Recommendations  None recommended by OT    Recommendations for Other Services       Precautions / Restrictions Precautions Precautions: Back Precaution Booklet Issued: Yes (comment) Precaution  Comments: reviewed in full Required Braces or Orthoses: Spinal Brace Spinal Brace: Lumbar corset;Applied in sitting position;Applied in standing position Restrictions Weight Bearing Restrictions: No      Mobility Bed Mobility Overal bed mobility: Needs Assistance Bed Mobility: Rolling, Sidelying to Sit, Sit to Sidelying Rolling: Supervision Sidelying to sit: Supervision     Sit to sidelying: Supervision General bed mobility comments: edcuated in log roll, excellent performance    Transfers Overall transfer level: Modified independent Equipment used: None                      Balance Overall balance assessment: No apparent balance deficits (not formally assessed)                                         ADL either performed or assessed with clinical judgement   ADL Overall ADL's : Modified independent                                       General ADL Comments: educated on compensatory strategies for rear peri care, standing grooming, donning brace, lower body ADL, Pt able to recieve all education and execute at supervision level in hospital- no assist needed     Vision Ability to See in Adequate Light: 0 Adequate Patient Visual Report: No change from baseline Vision Assessment?: No apparent visual deficits     Perception     Praxis      Pertinent Vitals/Pain Pain Assessment Pain Assessment: 0-10 Pain Score: 4  Pain Location:  incision site Pain Descriptors / Indicators: Discomfort Pain Intervention(s): Monitored during session, Repositioned     Hand Dominance Left   Extremity/Trunk Assessment Upper Extremity Assessment Upper Extremity Assessment: Overall WFL for tasks assessed   Lower Extremity Assessment Lower Extremity Assessment: Overall WFL for tasks assessed   Cervical / Trunk Assessment Cervical / Trunk Assessment: Back Surgery   Communication Communication Communication: No difficulties   Cognition  Arousal/Alertness: Awake/alert Behavior During Therapy: WFL for tasks assessed/performed Overall Cognitive Status: Within Functional Limits for tasks assessed                                       General Comments  son present throughout session    Exercises     Shoulder Instructions      Home Living Family/patient expects to be discharged to:: Private residence Living Arrangements: Children (adult son staying with her from [place]) Available Help at Discharge: Family;Available 24 hours/day Type of Home: House Home Access: Stairs to enter [employer] of Steps: 3   Home Layout: One level     Bathroom Shower/Tub: Chief Strategy Officer: Standard     Home Equipment: None          Prior Functioning/Environment Prior Level of Function : Independent/Modified Independent                        OT Problem List: Decreased strength;Decreased activity tolerance;Impaired balance (sitting and/or standing);Decreased knowledge of use of DME or AE;Pain      OT Treatment/Interventions:      OT Goals(Current goals can be found in the care plan section) Acute Rehab OT Goals Patient Stated Goal: be independent OT Goal Formulation: With patient Time For Goal Achievement: 09/17/22 Potential to Achieve Goals: Good  OT Frequency:      Co-evaluation              AM-PAC OT "6 Clicks" Daily Activity     Outcome Measure Help from another person eating meals?: None Help from another person taking care of personal grooming?: None Help from another person toileting, which includes using toliet, bedpan, or urinal?: None Help from another person bathing (including washing, rinsing, drying)?: A Little Help from another person to put on and taking off regular upper body clothing?: None Help from another person to put on and taking off regular lower body clothing?: None 6 Click Score: 23   End of Session Equipment Utilized During  Treatment: Gait belt;Back brace Nurse Communication: Mobility status  Activity Tolerance: Patient tolerated treatment well Patient left: in bed;with call bell/phone within reach;with family/visitor present (sitting on EOB)  OT Visit Diagnosis: Other abnormalities of gait and mobility (R26.89);Other symptoms and signs involving the nervous system (R29.898)                Time: 0830-0900 OT Time Calculation (min): 30 min Charges:  OT General Charges $OT Visit: 1 Visit OT Evaluation $OT Eval Low Complexity: 1 Low  Nyoka Cowden OTR/L Acute Rehabilitation Services Office: [phone]  Evern Bio [facility] 09/03/2022, 10:13 AM

## 2022-09-04 ENCOUNTER — Other Ambulatory Visit: Payer: Self-pay

## 2022-09-04 ENCOUNTER — Encounter (HOSPITAL_COMMUNITY): Payer: Self-pay | Admitting: *Deleted

## 2022-09-04 ENCOUNTER — Emergency Department (HOSPITAL_COMMUNITY): Payer: PPO

## 2022-09-04 ENCOUNTER — Inpatient Hospital Stay (HOSPITAL_COMMUNITY)
Admission: EM | Admit: 2022-09-04 | Discharge: 2022-09-06 | DRG: 864 | Disposition: A | Discharge status: Home / Self Care | Payer: PPO | Attending: Family Medicine | Admitting: Family Medicine

## 2022-09-04 DIAGNOSIS — N179 Acute kidney failure, unspecified: Secondary | ICD-10-CM | POA: Diagnosis present

## 2022-09-04 DIAGNOSIS — Z87891 Personal history of nicotine dependence: Secondary | ICD-10-CM

## 2022-09-04 DIAGNOSIS — Z880 Allergy status to penicillin: Secondary | ICD-10-CM

## 2022-09-04 DIAGNOSIS — Z8249 Family history of ischemic heart disease and other diseases of the circulatory system: Secondary | ICD-10-CM

## 2022-09-04 DIAGNOSIS — Z79890 Hormone replacement therapy (postmenopausal): Secondary | ICD-10-CM

## 2022-09-04 DIAGNOSIS — E876 Hypokalemia: Secondary | ICD-10-CM | POA: Diagnosis not present

## 2022-09-04 DIAGNOSIS — K449 Diaphragmatic hernia without obstruction or gangrene: Secondary | ICD-10-CM | POA: Diagnosis not present

## 2022-09-04 DIAGNOSIS — Z801 Family history of malignant neoplasm of trachea, bronchus and lung: Secondary | ICD-10-CM

## 2022-09-04 DIAGNOSIS — Z800 Family history of malignant neoplasm of digestive organs: Secondary | ICD-10-CM

## 2022-09-04 DIAGNOSIS — Z981 Arthrodesis status: Secondary | ICD-10-CM

## 2022-09-04 DIAGNOSIS — D649 Anemia, unspecified: Secondary | ICD-10-CM | POA: Diagnosis present

## 2022-09-04 DIAGNOSIS — I129 Hypertensive chronic kidney disease with stage 1 through stage 4 chronic kidney disease, or unspecified chronic kidney disease: Secondary | ICD-10-CM | POA: Diagnosis present

## 2022-09-04 DIAGNOSIS — I959 Hypotension, unspecified: Secondary | ICD-10-CM | POA: Diagnosis present

## 2022-09-04 DIAGNOSIS — K769 Liver disease, unspecified: Secondary | ICD-10-CM | POA: Diagnosis not present

## 2022-09-04 DIAGNOSIS — R509 Fever, unspecified: Secondary | ICD-10-CM | POA: Diagnosis present

## 2022-09-04 DIAGNOSIS — Z79899 Other long term (current) drug therapy: Secondary | ICD-10-CM

## 2022-09-04 DIAGNOSIS — E039 Hypothyroidism, unspecified: Secondary | ICD-10-CM | POA: Diagnosis present

## 2022-09-04 DIAGNOSIS — K5730 Diverticulosis of large intestine without perforation or abscess without bleeding: Secondary | ICD-10-CM | POA: Diagnosis not present

## 2022-09-04 DIAGNOSIS — R5082 Postprocedural fever: Secondary | ICD-10-CM | POA: Diagnosis not present

## 2022-09-04 DIAGNOSIS — Z602 Problems related to living alone: Secondary | ICD-10-CM | POA: Diagnosis present

## 2022-09-04 DIAGNOSIS — M4316 Spondylolisthesis, lumbar region: Secondary | ICD-10-CM | POA: Diagnosis present

## 2022-09-04 DIAGNOSIS — N189 Chronic kidney disease, unspecified: Secondary | ICD-10-CM | POA: Diagnosis present

## 2022-09-04 DIAGNOSIS — N1832 Chronic kidney disease, stage 3b: Secondary | ICD-10-CM | POA: Diagnosis present

## 2022-09-04 DIAGNOSIS — M4800 Spinal stenosis, site unspecified: Secondary | ICD-10-CM | POA: Diagnosis present

## 2022-09-04 DIAGNOSIS — I10 Essential (primary) hypertension: Secondary | ICD-10-CM | POA: Diagnosis present

## 2022-09-04 DIAGNOSIS — R109 Unspecified abdominal pain: Secondary | ICD-10-CM | POA: Diagnosis not present

## 2022-09-04 DIAGNOSIS — E785 Hyperlipidemia, unspecified: Secondary | ICD-10-CM | POA: Diagnosis present

## 2022-09-04 DIAGNOSIS — Z6833 Body mass index (BMI) 33.0-33.9, adult: Secondary | ICD-10-CM

## 2022-09-04 DIAGNOSIS — Z833 Family history of diabetes mellitus: Secondary | ICD-10-CM

## 2022-09-04 DIAGNOSIS — Z8701 Personal history of pneumonia (recurrent): Secondary | ICD-10-CM

## 2022-09-04 DIAGNOSIS — K219 Gastro-esophageal reflux disease without esophagitis: Secondary | ICD-10-CM | POA: Diagnosis present

## 2022-09-04 DIAGNOSIS — Z1152 Encounter for screening for COVID-19: Secondary | ICD-10-CM

## 2022-09-04 DIAGNOSIS — R3911 Hesitancy of micturition: Secondary | ICD-10-CM | POA: Diagnosis present

## 2022-09-04 DIAGNOSIS — Z83719 Family history of colon polyps, unspecified: Secondary | ICD-10-CM

## 2022-09-04 DIAGNOSIS — E860 Dehydration: Secondary | ICD-10-CM | POA: Diagnosis present

## 2022-09-04 DIAGNOSIS — E669 Obesity, unspecified: Secondary | ICD-10-CM | POA: Diagnosis present

## 2022-09-04 DIAGNOSIS — M1990 Unspecified osteoarthritis, unspecified site: Secondary | ICD-10-CM | POA: Diagnosis present

## 2022-09-04 LAB — CBC WITH DIFFERENTIAL/PLATELET
Abs Immature Granulocytes: 0.05 10*3/uL (ref 0.00–0.07)
Basophils Absolute: 0.1 10*3/uL (ref 0.0–0.1)
Basophils Relative: 1 %
Eosinophils Absolute: 0 10*3/uL (ref 0.0–0.5)
Eosinophils Relative: 0 %
HCT: 34.4 % — ABNORMAL LOW (ref 36.0–46.0)
Hemoglobin: 11.2 g/dL — ABNORMAL LOW (ref 12.0–15.0)
Immature Granulocytes: 0 %
Lymphocytes Relative: 15 %
Lymphs Abs: 1.9 10*3/uL (ref 0.7–4.0)
MCH: 30.4 pg (ref 26.0–34.0)
MCHC: 32.6 g/dL (ref 30.0–36.0)
MCV: 93.5 fL (ref 80.0–100.0)
Monocytes Absolute: 1.3 10*3/uL — ABNORMAL HIGH (ref 0.1–1.0)
Monocytes Relative: 10 %
Neutro Abs: 9.8 10*3/uL — ABNORMAL HIGH (ref 1.7–7.7)
Neutrophils Relative %: 74 %
Platelets: 279 10*3/uL (ref 150–400)
RBC: 3.68 MIL/uL — ABNORMAL LOW (ref 3.87–5.11)
RDW: 13.7 % (ref 11.5–15.5)
WBC: 13.2 10*3/uL — ABNORMAL HIGH (ref 4.0–10.5)
nRBC: 0 % (ref 0.0–0.2)

## 2022-09-04 LAB — COMPREHENSIVE METABOLIC PANEL
ALT: 11 U/L (ref 0–44)
AST: 14 U/L — ABNORMAL LOW (ref 15–41)
Albumin: 3.5 g/dL (ref 3.5–5.0)
Alkaline Phosphatase: 51 U/L (ref 38–126)
Anion gap: 10 (ref 5–15)
BUN: 26 mg/dL — ABNORMAL HIGH (ref 8–23)
CO2: 21 mmol/L — ABNORMAL LOW (ref 22–32)
Calcium: 8.3 mg/dL — ABNORMAL LOW (ref 8.9–10.3)
Chloride: 102 mmol/L (ref 98–111)
Creatinine, Ser: 1.9 mg/dL — ABNORMAL HIGH (ref 0.44–1.00)
GFR, Estimated: 29 mL/min — ABNORMAL LOW (ref >60)
Glucose, Bld: 129 mg/dL — ABNORMAL HIGH (ref 70–99)
Potassium: 3.3 mmol/L — ABNORMAL LOW (ref 3.5–5.1)
Sodium: 133 mmol/L — ABNORMAL LOW (ref 135–145)
Total Bilirubin: 0.5 mg/dL (ref 0.3–1.2)
Total Protein: 6 g/dL — ABNORMAL LOW (ref 6.5–8.1)

## 2022-09-04 LAB — I-STAT CG4 LACTIC ACID, ED: Lactic Acid, Venous: 1.4 mmol/L (ref 0.5–1.9)

## 2022-09-04 LAB — HIV ANTIBODY (ROUTINE TESTING W REFLEX): HIV Screen 4th Generation wRfx: NONREACTIVE

## 2022-09-04 LAB — URINALYSIS, W/ REFLEX TO CULTURE (INFECTION SUSPECTED)
Bilirubin Urine: NEGATIVE
Glucose, UA: NEGATIVE mg/dL
Ketones, ur: NEGATIVE mg/dL
Leukocytes,Ua: NEGATIVE
Nitrite: NEGATIVE
Protein, ur: NEGATIVE mg/dL
Specific Gravity, Urine: 1.009 (ref 1.005–1.030)
pH: 5 (ref 5.0–8.0)

## 2022-09-04 LAB — PROTIME-INR
INR: 1.1 (ref 0.8–1.2)
Prothrombin Time: 14 seconds (ref 11.4–15.2)

## 2022-09-04 LAB — SARS CORONAVIRUS 2 BY RT PCR: SARS Coronavirus 2 by RT PCR: NEGATIVE

## 2022-09-04 LAB — PROCALCITONIN: Procalcitonin: 0.1 ng/mL

## 2022-09-04 MED ORDER — OXYCODONE HCL 5 MG PO TABS: 5.0000 mg | ORAL_TABLET | ORAL | Status: DC | PRN

## 2022-09-04 MED ORDER — PANTOPRAZOLE SODIUM 40 MG PO TBEC
40.0000 mg | DELAYED_RELEASE_TABLET | Freq: Every day | ORAL | Status: DC
  Administered 2022-09-04 – 2022-09-06 (×3): 40 mg via ORAL
  Filled 2022-09-04 (×3): qty 1

## 2022-09-04 MED ORDER — SODIUM CHLORIDE 0.9 % IV BOLUS (SEPSIS)
1000.0000 mL | Freq: Once | INTRAVENOUS | Status: AC
  Administered 2022-09-04: 1000 mL via INTRAVENOUS

## 2022-09-04 MED ORDER — ACETAMINOPHEN 325 MG PO TABS
650.0000 mg | ORAL_TABLET | Freq: Four times a day (QID) | ORAL | Status: DC | PRN
  Administered 2022-09-04 – 2022-09-05 (×5): 650 mg via ORAL
  Filled 2022-09-04 (×5): qty 2

## 2022-09-04 MED ORDER — OXYCODONE-ACETAMINOPHEN 5-325 MG PO TABS
1.0000 | ORAL_TABLET | ORAL | Status: DC | PRN
  Administered 2022-09-04 – 2022-09-06 (×7): 1 via ORAL
  Filled 2022-09-04 (×7): qty 1

## 2022-09-04 MED ORDER — POLYETHYLENE GLYCOL 3350 17 G PO PACK
17.0000 g | PACK | Freq: Every day | ORAL | Status: DC | PRN
  Administered 2022-09-05: 17 g via ORAL
  Filled 2022-09-04: qty 1

## 2022-09-04 MED ORDER — SODIUM CHLORIDE 0.9 % IV SOLN
1.0000 g | Freq: Once | INTRAVENOUS | Status: AC
  Administered 2022-09-04: 1 g via INTRAVENOUS
  Filled 2022-09-04: qty 10

## 2022-09-04 MED ORDER — ACETAMINOPHEN 650 MG RE SUPP: 650.0000 mg | Freq: Four times a day (QID) | RECTAL | Status: DC | PRN

## 2022-09-04 MED ORDER — SENNOSIDES-DOCUSATE SODIUM 8.6-50 MG PO TABS
1.0000 | ORAL_TABLET | Freq: Every day | ORAL | Status: DC
  Administered 2022-09-04 – 2022-09-05 (×2): 1 via ORAL
  Filled 2022-09-04 (×2): qty 1

## 2022-09-04 MED ORDER — BISACODYL 5 MG PO TBEC
5.0000 mg | DELAYED_RELEASE_TABLET | Freq: Every day | ORAL | Status: DC | PRN
  Administered 2022-09-05: 5 mg via ORAL
  Filled 2022-09-04: qty 1

## 2022-09-04 MED ORDER — LEVOTHYROXINE SODIUM 125 MCG PO TABS
125.0000 ug | ORAL_TABLET | Freq: Every day | ORAL | Status: DC
  Administered 2022-09-05 – 2022-09-06 (×2): 125 ug via ORAL
  Filled 2022-09-04 (×3): qty 1

## 2022-09-04 MED ORDER — METOPROLOL SUCCINATE ER 25 MG PO TB24
100.0000 mg | ORAL_TABLET | Freq: Every day | ORAL | Status: DC
  Administered 2022-09-05 – 2022-09-06 (×2): 100 mg via ORAL
  Filled 2022-09-04 (×2): qty 4

## 2022-09-04 MED ORDER — DOCUSATE SODIUM 100 MG PO CAPS
100.0000 mg | ORAL_CAPSULE | Freq: Two times a day (BID) | ORAL | Status: DC
  Administered 2022-09-04: 100 mg via ORAL
  Filled 2022-09-04: qty 1

## 2022-09-04 MED ORDER — LACTATED RINGERS IV SOLN: INTRAVENOUS | Status: DC

## 2022-09-04 MED ORDER — ALLOPURINOL 100 MG PO TABS
100.0000 mg | ORAL_TABLET | Freq: Every day | ORAL | Status: DC
  Administered 2022-09-04 – 2022-09-06 (×3): 100 mg via ORAL
  Filled 2022-09-04 (×3): qty 1

## 2022-09-04 MED ORDER — ONDANSETRON HCL 4 MG/2ML IJ SOLN: 4.0000 mg | Freq: Four times a day (QID) | INTRAMUSCULAR | Status: DC | PRN

## 2022-09-04 MED ORDER — ONDANSETRON HCL 4 MG PO TABS: 4.0000 mg | ORAL_TABLET | Freq: Four times a day (QID) | ORAL | Status: DC | PRN

## 2022-09-04 MED ORDER — HYDRALAZINE HCL 20 MG/ML IJ SOLN: 5.0000 mg | INTRAMUSCULAR | Status: DC | PRN

## 2022-09-04 MED ORDER — ALBUTEROL SULFATE (2.5 MG/3ML) 0.083% IN NEBU: 2.5000 mg | INHALATION_SOLUTION | RESPIRATORY_TRACT | Status: DC | PRN

## 2022-09-04 MED ORDER — SODIUM CHLORIDE 0.9 % IV BOLUS
1000.0000 mL | Freq: Once | INTRAVENOUS | Status: AC
  Administered 2022-09-04: 1000 mL via INTRAVENOUS

## 2022-09-04 MED ORDER — ENOXAPARIN SODIUM 40 MG/0.4ML IJ SOSY
40.0000 mg | PREFILLED_SYRINGE | INTRAMUSCULAR | Status: DC
  Administered 2022-09-04 – 2022-09-05 (×2): 40 mg via SUBCUTANEOUS
  Filled 2022-09-04 (×2): qty 0.4

## 2022-09-04 MED ORDER — ATORVASTATIN CALCIUM 40 MG PO TABS
20.0000 mg | ORAL_TABLET | Freq: Every day | ORAL | Status: DC
  Administered 2022-09-04 – 2022-09-06 (×3): 20 mg via ORAL
  Filled 2022-09-04 (×3): qty 1

## 2022-09-04 MED ORDER — METHOCARBAMOL 500 MG PO TABS: 500.0000 mg | ORAL_TABLET | Freq: Four times a day (QID) | ORAL | Status: DC | PRN

## 2022-09-04 MED ORDER — ACETAMINOPHEN 325 MG PO TABS
650.0000 mg | ORAL_TABLET | Freq: Once | ORAL | Status: AC
  Administered 2022-09-04: 650 mg via ORAL
  Filled 2022-09-04: qty 2

## 2022-09-04 MED ORDER — OXYCODONE-ACETAMINOPHEN 10-325 MG PO TABS: 1.0000 | ORAL_TABLET | ORAL | Status: DC | PRN

## 2022-09-04 NOTE — ED Notes (Signed)
On visual inspection surgical site does not show any redness, warmth or swelling. No drainage noted. Original surgical dressing in place and intact.

## 2022-09-04 NOTE — ED Provider Notes (Signed)
[place] [facility]GENCY DEPARTMENT AT [facility] Provider Note   CSN: [id number] Arrival date & time: 09/04/22  5621     History  Chief Complaint  Patient presents with   Back Pain    Stacey Powell is a 67 y.o. female.  The history is provided by the patient.  Patient with recent back surgery presents for fever. Patient reports she underwent back/lumbar surgery and was just discharged yesterday morning.  She now reports she is having a fever but no other real symptoms.  No vomiting or diarrhea.  No headache.  No chest pain or shortness of breath.  No abdominal pain.  She reports her postoperative pain has been well-controlled.  There has been no new weakness    Past Medical History:  Diagnosis Date   Allergy    Arthritis    GERD (gastroesophageal reflux disease)    History of pneumonia    Hypertension    Hypothyroidism    Obesity    Pneumonia    about 67 years old   Thyroid disease    hypothyroid    Home Medications Prior to Admission medications   Medication Sig Start Date End Date Taking? Authorizing Provider  allopurinol (ZYLOPRIM) 100 MG tablet Take 100 mg by mouth daily.    [provider]  atorvastatin (LIPITOR) 20 MG tablet Take 20 mg by mouth daily. 07/29/22   [provider]  hydrochlorothiazide (HYDRODIURIL) 25 MG tablet Take 25 mg by mouth daily. 04/07/17   [provider]  levothyroxine (SYNTHROID, LEVOTHROID) 125 MCG tablet Take 125 mcg by mouth daily before breakfast. 02/27/17   [provider]  losartan (COZAAR) 100 MG tablet Take 100 mg by mouth daily. 02/27/17   [provider]  methocarbamol (ROBAXIN) 500 MG tablet Take 1 tablet (500 mg total) by mouth every 6 (six) hours as needed for muscle spasms. 09/03/22   Iran Sizer, PA-C  metoprolol succinate (TOPROL-XL) 100 MG 24 hr tablet Take 100 mg by mouth daily. 04/07/17   [provider]  omeprazole (PRILOSEC) 20 MG capsule Take 20 mg by  mouth daily. 03/11/17   [provider]  oxyCODONE-acetaminophen (PERCOCET) 10-325 MG tablet Take 1 tablet by mouth every 4 (four) hours as needed for up to 5 days for pain. 09/03/22 09/08/22  Iran Sizer, PA-C      Allergies    Penicillins    Review of Systems   Review of Systems  Constitutional:  Positive for fever.  Respiratory:  Negative for cough.   Gastrointestinal:  Negative for diarrhea and vomiting.  Neurological:  Negative for weakness.    Physical Exam Updated Vital Signs BP (!) 104/47 (BP Location: Left Arm)   Pulse 91   Temp 99.4 F (37.4 C) (Oral)   Resp 16   Ht 1.702 m (5\' 7" )   Wt 98 kg   SpO2 100%   BMI 33.84 kg/m  Physical Exam CONSTITUTIONAL: Well developed/well nourished HEAD: Normocephalic/atraumatic EYES: EOMI/PERRL ENMT: Mucous membranes moist NECK: supple no meningeal signs SPINE/BACK: Binder in place CV: S1/S2 noted, tach cardiac LUNGS: Lungs are clear to auscultation bilaterally, no apparent distress ABDOMEN: soft, nontender, no rebound or guarding, bowel sounds noted throughout abdomen GU:no cva tenderness NEURO: Pt is awake/alert/appropriate, moves all extremitiesx4.  No facial droop.  She is able to move both lower extremities without difficulty EXTREMITIES: pulses normal/equal, full ROM SKIN: warm, color normal PSYCH: no abnormalities of mood noted, alert and oriented to situation  ED Results /  Procedures / Treatments   Labs (all labs ordered are listed, but only abnormal results are displayed) Labs Reviewed  COMPREHENSIVE METABOLIC PANEL - Abnormal; Notable for the following components:      Result Value   Sodium 133 (*)    Potassium 3.3 (*)    CO2 21 (*)    Glucose, Bld 129 (*)    BUN 26 (*)    Creatinine, Ser 1.90 (*)    Calcium 8.3 (*)    Total Protein 6.0 (*)    AST 14 (*)    GFR, Estimated 29 (*)    All other components within normal limits  CBC WITH DIFFERENTIAL/PLATELET - Abnormal; Notable for the  following components:   WBC 13.2 (*)    RBC 3.68 (*)    Hemoglobin 11.2 (*)    HCT 34.4 (*)    Neutro Abs 9.8 (*)    Monocytes Absolute 1.3 (*)    All other components within normal limits  URINALYSIS, W/ REFLEX TO CULTURE (INFECTION SUSPECTED) - Abnormal; Notable for the following components:   Hgb urine dipstick MODERATE (*)    Bacteria, UA RARE (*)    All other components within normal limits  SARS CORONAVIRUS 2 BY RT PCR  CULTURE, BLOOD (ROUTINE X 2)  CULTURE, BLOOD (ROUTINE X 2)  PROTIME-INR  I-STAT CG4 LACTIC ACID, ED    EKG None  Radiology CT Renal Stone Study  Result Date: 09/04/2022 CLINICAL DATA:  67 year old female with history of abdominal and flank pain. Recent history of back surgery. EXAM: CT ABDOMEN AND PELVIS WITHOUT CONTRAST TECHNIQUE: Multidetector CT imaging of the abdomen and pelvis was performed following the standard protocol without IV contrast. RADIATION DOSE REDUCTION: This exam was performed according to the departmental dose-optimization program which includes automated exposure control, adjustment of the mA and/or kV according to patient size and/or use of iterative reconstruction technique. COMPARISON:  CT of the abdomen and pelvis 06/05/2003. FINDINGS: Lower chest: Atherosclerotic calcifications in the descending thoracic aorta. Small hiatal hernia. Hepatobiliary: Subcentimeter low-attenuation lesion in segment 2 of the liver, too small to characterize, but statistically likely a tiny cyst (no imaging follow-up recommended). No other definite suspicious appearing hepatic lesions are noted. Unenhanced appearance of the gallbladder is unremarkable. Pancreas: No definite pancreatic mass or peripancreatic fluid collections or inflammatory changes are noted on today's noncontrast CT examination. Spleen: Unremarkable. Adrenals/Urinary Tract: 2 cm low-attenuation lesion in the upper pole of the left kidney (axial image 27 of series 3), incompletely characterized on  today's noncontrast CT examination, but statistically likely a cyst. No calcifications are identified within the collecting system of either kidney, along the course of either ureter, or within the lumen of the urinary bladder. No hydroureteronephrosis. Unenhanced appearance of the right kidney, bilateral adrenal glands and urinary bladder is unremarkable. Stomach/Bowel: Unenhanced appearance of the stomach is normal. No pathologic dilatation of small bowel or colon. Multiple colonic diverticuli are noted, most evident in the descending colon and sigmoid colon, without surrounding inflammatory changes to suggest an acute diverticulitis at this time. Normal appendix. Vascular/Lymphatic: Atherosclerosis in the abdominal aorta and pelvic vasculature. No lymphadenopathy noted in the abdomen or pelvis. Reproductive: Small calcifications are noted within the uterus, likely partially calcified fibroids. Ovaries are unremarkable in appearance. Other: No significant volume of ascites.  No pneumoperitoneum. Musculoskeletal: Postoperative changes of recent PLIF are noted at L4-L5 with interbody cages at L4-L5 interspace. There is some paraspinal fluid and gas, as well as a small amount of gas within the  spinal canal, presumably secondary to the recent surgery. No well organized paraspinal fluid collection noted to suggest abscess. There are no aggressive appearing lytic or blastic lesions noted in the visualized portions of the skeleton. IMPRESSION: 1. No acute abnormality in the abdomen or pelvis to account for the patient's symptoms. 2. Postoperative changes from recent PLIF at L4-L5 with expected postoperative findings, as above. 3. Colonic diverticulosis without evidence of acute diverticulitis at this time. 4. Aortic atherosclerosis. Electronically Signed   By: Trudie Reed M.D.   On: 09/04/2022 07:08   DG Chest Portable 1 View  Result Date: 09/04/2022 CLINICAL DATA:  Fevers EXAM: PORTABLE CHEST 1 VIEW COMPARISON:   None Available. FINDINGS: The heart size and mediastinal contours are within normal limits. Both lungs are clear. The visualized skeletal structures are unremarkable. IMPRESSION: No active disease. Electronically Signed   By: Alcide Clever M.D.   On: 09/04/2022 03:42   DG Lumbar Spine 2-3 Views  Result Date: 09/02/2022 CLINICAL DATA:  Elective surgery EXAM: LUMBAR SPINE - 2-3 VIEW COMPARISON:  MRI 06/29/2022 FINDINGS: Three low resolution intraoperative spot views of the lumbar spine. Total fluoroscopy time was 34 seconds, fluoroscopic dose of 33.86 mGy. Images demonstrate fixating screws and interbody device at what appears to be L4-L5 level. IMPRESSION: Intraoperative fluoroscopic assistance provided during lumbar spine surgery. Electronically Signed   By: Jasmine Pang M.D.   On: 09/02/2022 16:47   DG C-Arm 1-60 Min-No Report  Result Date: 09/02/2022 Fluoroscopy was utilized by the requesting physician.  No radiographic interpretation.   DG C-Arm 1-60 Min-No Report  Result Date: 09/02/2022 Fluoroscopy was utilized by the requesting physician.  No radiographic interpretation.    Procedures .Critical Care  Performed by: Zadie Rhine, MD Authorized by: Zadie Rhine, MD   Critical care provider statement:    Critical care time (minutes):  80   Critical care start time:  09/04/2022 3:00 AM   Critical care end time:  09/04/2022 4:20 AM   Critical care time was exclusive of:  Separately billable procedures and treating other patients   Critical care was necessary to treat or prevent imminent or life-threatening deterioration of the following conditions:  Sepsis and shock   Critical care was time spent personally by me on the following activities:  Examination of patient, re-evaluation of patient's condition, ordering and review of radiographic studies, pulse oximetry, ordering and review of laboratory studies, discussions with consultants, development of treatment plan with patient or  surrogate and evaluation of patient's response to treatment   I assumed direction of critical care for this patient from another provider in my specialty: no       Medications Ordered in ED Medications  sodium chloride 0.9 % bolus 1,000 mL (0 mLs Intravenous Stopped 09/04/22 0636)  acetaminophen (TYLENOL) tablet 650 mg (650 mg Oral Given 09/04/22 0356)  sodium chloride 0.9 % bolus 1,000 mL (0 mLs Intravenous Stopped 09/04/22 0611)  sodium chloride 0.9 % bolus 1,000 mL (0 mLs Intravenous Stopped 09/04/22 0705)  cefTRIAXone (ROCEPHIN) 1 g in sodium chloride 0.9 % 100 mL IVPB (0 g Intravenous Stopped 09/04/22 0646)    ED Course/ Medical Decision Making/ A&P Clinical Course as of 09/04/22 0733  Sun Sep 04, 2022  0342 WBC(!): 13.2 Leukocytosis [DW]  0350 Patient was just discharged yesterday morning after undergoing laminectomy as well as lumbar fusion.  She has done well postoperatively with her pain until having a fever tonight.  She has no other real symptoms.  She is mildly  tachycardic with a decreased blood pressure.  However her lactic acid is normal.  Blood cultures have been sent.  IV fluids have been ordered.  Will defer antibiotics for now Will defer wound exploration as the chance of wound infection is very low [DW]  0350 Discussed with on-call APP Cosentino with NSGY She knows patient from yesterday's discharge summary. Plan will be to await final results and then come up with plan.  Will defer antibiotics for now [DW]  0412 Creatinine(!): 1.90 Acute kidney injury [DW]  1610 Patient resting comfortable, no acute complaints [DW]  [phone] Patient still having episodes of hypotension.  Unclear cause of the fever.  I did evaluate the wound and it appears to be without any discharge or obvious tenderness.  Patient did have evidence of hematuria but no obvious UTI.  She also has an AKI.  Will obtain CT abdomen pelvis to evaluate for any other cause of her hypotension and fever.  Patient was also  given Rocephin, but reports she has tolerated cephalosporins previously despite penicillin allergy.  Patient is receiving her third liter of IV fluids.  Discussed with neurosurgery, they will see her in the ER [DW]  0732 No acute findings on CT imaging.  Blood pressure is improving, systolic above 100. Patient has been ambulating and no new complaints Unclear cause of fever, but low blood pressure could be related to her taking her hypertension medicines and pain meds [DW]  [phone] Discussed with Dr. Ophelia Charter for admission [DW]    Clinical Course User Index [DW] Zadie Rhine, MD                             Medical Decision Making Amount and/or Complexity of Data Reviewed Labs:  Decision-making details documented in ED Course. Radiology: ordered.  Risk OTC drugs. Decision regarding hospitalization.   This patient presents to the ED for concern of fever, this involves an extensive number of treatment options, and is a complaint that carries with it a high risk of complications and morbidity.  The differential diagnosis includes but is not limited to meningitis, pneumonia, UTI, postoperative infection, discitis, epidural abscess, cellulitis, intra-abdominal infection  Comorbidities that complicate the patient evaluation: Patient's presentation is complicated by their history of recent surgery  Additional history obtained: Additional history obtained from family Records reviewed previous admission documents  Lab Tests: I Ordered, and personally interpreted labs.  The pertinent results include: Leukocytosis  Imaging Studies ordered: I ordered imaging studies including X-ray chest   I independently visualized and interpreted imaging which showed no acute findings I agree with the radiologist interpretation  Cardiac Monitoring: The patient was maintained on a cardiac monitor.  I personally viewed and interpreted the cardiac monitor which showed an underlying rhythm of:  sinus  rhythm  Medicines ordered and prescription drug management: I ordered medication including IV fluids for hypotension Reevaluation of the patient after these medicines showed that the patient    stayed the same    Critical Interventions:  IV fluids and antibiotics  Consultations Obtained: I requested consultation with the consultant nsgy , and discussed  findings as well as pertinent plan - they recommend: will see in the ER  Reevaluation: After the interventions noted above, I reevaluated the patient and found that they have :improved  Complexity of problems addressed: Patient's presentation is most consistent with  acute presentation with potential threat to life or bodily function  Disposition: After consideration of the diagnostic results  and the patient's response to treatment,  I feel that the patent would benefit from admission   .           Final Clinical Impression(s) / ED Diagnoses Final diagnoses:  Acute febrile illness  AKI (acute kidney injury) [facility])    Rx / DC Orders ED Discharge Orders     None         Zadie Rhine, MD 09/04/22 [phone]

## 2022-09-04 NOTE — Plan of Care (Signed)
Patient alert/oriented X4. Patient compliant with medication administration and ate a few bites of her lunch on admission. Patient VSS upon admission and skin assessed with Gilman Buttner, RN. Patient presented with laminectomy incision on lower back. Patient last oral temperature 100.2. Patient given percocet as needed for pain. Patient tolerating cont. IV fluids. Will continue to monitor.   Problem: Education: Goal: Ability to verbalize activity precautions or restrictions will improve Outcome: Progressing   Problem: Education: Goal: Knowledge of the prescribed therapeutic regimen will improve Outcome: Progressing   Problem: Education: Goal: Understanding of discharge needs will improve Outcome: Progressing   Problem: Activity: Goal: Ability to avoid complications of mobility impairment will improve Outcome: Progressing   Problem: Activity: Goal: Ability to tolerate increased activity will improve Outcome: Progressing   Problem: Activity: Goal: Will remain free from falls Outcome: Progressing   Problem: Bowel/Gastric: Goal: Gastrointestinal status for postoperative course will improve Outcome: Progressing   Problem: Clinical Measurements: Goal: Ability to maintain clinical measurements within normal limits will improve Outcome: Progressing   Problem: Clinical Measurements: Goal: Postoperative complications will be avoided or minimized Outcome: Progressing   Problem: Clinical Measurements: Goal: Diagnostic test results will improve Outcome: Progressing   Problem: Pain Management: Goal: Pain level will decrease Outcome: Progressing   Problem: Skin Integrity: Goal: Will show signs of wound healing Outcome: Progressing   Problem: Health Behavior/Discharge Planning: Goal: Identification of resources available to assist in meeting health care needs will improve Outcome: Progressing   Problem: Bladder/Genitourinary: Goal: Urinary functional status for postoperative course  will improve Outcome: Progressing

## 2022-09-04 NOTE — ED Notes (Signed)
ED TO INPATIENT HANDOFF REPORT  ED Nurse Name and Phone #: Morrie Sheldon [phone]  S Name/Age/Gender Stacey Powell 67 y.o. female Room/Bed: 005C/005C  Code Status   Code Status: Full Code  Home/SNF/Other Home Patient oriented to: self, place, time, and situation Is this baseline? Yes   Triage Complete: Triage complete  Chief Complaint Fever [R50.9]  Triage Note The pt had back surgery in the last few days  she was discharged from here yesterday am   Allergies Allergies  Allergen Reactions   Penicillins     Unknown reaction    Level of Care/Admitting Diagnosis ED Disposition     ED Disposition  Admit   Condition  --   Comment  Hospital Area: [facility] [[id number]]  Level of Care: Med-Surg [16]  May place patient in observation at [facility] or [facility] if equivalent level of care is available:: No  Covid Evaluation: Asymptomatic - no recent exposure (last 10 days) testing not required  Diagnosis: Fever [454098]  Admitting Physician: Jonah Blue [[id number]]  Attending Physician: Jonah Blue [[id number]]          B Medical/Surgery History Past Medical History:  Diagnosis Date   Allergy    Arthritis    GERD (gastroesophageal reflux disease)    History of pneumonia    Hypertension    Hypothyroidism    Obesity    Pneumonia    about 67 years old   Thyroid disease    hypothyroid   Past Surgical History:  Procedure Laterality Date   CATARACT EXTRACTION Right 03/2022   COLONOSCOPY     KNEE ARTHROSCOPY     left knee   UPPER GASTROINTESTINAL ENDOSCOPY       A IV Location/Drains/Wounds Patient Lines/Drains/Airways Status     Active Line/Drains/Airways     Name Placement date Placement time Site Days   Peripheral IV 09/04/22 20 G Left Forearm 09/04/22  0313  Forearm  less than 1            Intake/Output Last 24 hours  Intake/Output Summary (Last 24 hours) at 09/04/2022 1227 Last data filed at 09/04/2022 1191 Gross per 24 hour   Intake 3097.34 ml  Output --  Net 3097.34 ml    Labs/Imaging Results for orders placed or performed during the hospital encounter of 09/04/22 (from the past 48 hour(s))  Culture, blood (Routine x 2)     Status: None (Preliminary result)   Collection Time: 09/04/22  2:45 AM   Specimen: BLOOD  Result Value Ref Range   Specimen Description BLOOD BLOOD RIGHT ARM    Special Requests      BOTTLES DRAWN AEROBIC AND ANAEROBIC Blood Culture adequate volume   Culture      NO GROWTH < 12 HOURS Performed at [facility], [address] [place], [place] [address]    Report Status PENDING   Culture, blood (Routine x 2)     Status: None (Preliminary result)   Collection Time: 09/04/22  2:50 AM   Specimen: BLOOD  Result Value Ref Range   Specimen Description BLOOD BLOOD LEFT ARM    Special Requests      BOTTLES DRAWN AEROBIC ONLY Blood Culture adequate volume   Culture      NO GROWTH < 12 HOURS Performed at [facility], [address] [place], [place] [address]    Report Status PENDING   Comprehensive metabolic panel     Status: Abnormal   Collection Time: 09/04/22  3:00 AM  Result Value Ref Range   Sodium 133 (L) 135 - 145 mmol/L   Potassium 3.3 (L) 3.5 - 5.1 mmol/L   Chloride 102 98 - 111 mmol/L   CO2 21 (L) 22 - 32 mmol/L   Glucose, Bld 129 (H) 70 - 99 mg/dL    Comment: Glucose reference range applies only to samples taken after fasting for at least 8 hours.   BUN 26 (H) 8 - 23 mg/dL   Creatinine, Ser 8.11 (H) 0.44 - 1.00 mg/dL   Calcium 8.3 (L) 8.9 - 10.3 mg/dL   Total Protein 6.0 (L) 6.5 - 8.1 g/dL   Albumin 3.5 3.5 - 5.0 g/dL   AST 14 (L) 15 - 41 U/L   ALT 11 0 - 44 U/L   Alkaline Phosphatase 51 38 - 126 U/L   Total Bilirubin 0.5 0.3 - 1.2 mg/dL   GFR, Estimated 29 (L) >60 mL/min    Comment: (NOTE) Calculated using the CKD-EPI Creatinine Equation (2021)    Anion gap 10 5 - 15    Comment: Performed at [facility], [address] [place], [place]  [address]  CBC with Differential     Status: Abnormal   Collection Time: 09/04/22  3:00 AM  Result Value Ref Range   WBC 13.2 (H) 4.0 - 10.5 K/uL   RBC 3.68 (L) 3.87 - 5.11 MIL/uL   Hemoglobin 11.2 (L) 12.0 - 15.0 g/dL   HCT 29.5 (L) 62.1 - 30.8 %   MCV 93.5 80.0 - 100.0 fL   MCH 30.4 26.0 - 34.0 pg   MCHC 32.6 30.0 - 36.0 g/dL   RDW 65.7 84.6 - 96.2 %   Platelets 279 150 - 400 K/uL   nRBC 0.0 0.0 - 0.2 %   Neutrophils Relative % 74 %   Neutro Abs 9.8 (H) 1.7 - 7.7 K/uL   Lymphocytes Relative 15 %   Lymphs Abs 1.9 0.7 - 4.0 K/uL   Monocytes Relative 10 %   Monocytes Absolute 1.3 (H) 0.1 - 1.0 K/uL   Eosinophils Relative 0 %   Eosinophils Absolute 0.0 0.0 - 0.5 K/uL   Basophils Relative 1 %   Basophils Absolute 0.1 0.0 - 0.1 K/uL   Immature Granulocytes 0 %   Abs Immature Granulocytes 0.05 0.00 - 0.07 K/uL    Comment: Performed at [facility], [address] [place], [place] [address]  Protime-INR     Status: None   Collection Time: 09/04/22  3:00 AM  Result Value Ref Range   Prothrombin Time 14.0 11.4 - 15.2 seconds   INR 1.1 0.8 - 1.2    Comment: (NOTE) INR goal varies based on device and disease states. Performed at [facility], [address] [place], [place] [address]   I-Stat Lactic Acid, ED     Status: None   Collection Time: 09/04/22  3:12 AM  Result Value Ref Range   Lactic Acid, Venous 1.4 0.5 - 1.9 mmol/L  SARS Coronavirus 2 by RT PCR (hospital order, performed in [facility] hospital lab) *cepheid single result test* Anterior Nasal Swab     Status: None   Collection Time: 09/04/22  3:18 AM   Specimen: Anterior Nasal Swab  Result Value Ref Range   SARS Coronavirus 2 by RT PCR NEGATIVE NEGATIVE    Comment: Performed at [facility], [address], [place], [place] [address]  Urinalysis, w/ Reflex to Culture (Infection Suspected) -Urine, Clean  Catch     Status: Abnormal   Collection Time: 09/04/22  5:28 AM  Result Value Ref Range   Specimen  Source URINE, CATHETERIZED    Color, Urine YELLOW YELLOW   APPearance CLEAR CLEAR   Specific Gravity, Urine 1.009 1.005 - 1.030   pH 5.0 5.0 - 8.0   Glucose, UA NEGATIVE NEGATIVE mg/dL   Hgb urine dipstick MODERATE (A) NEGATIVE   Bilirubin Urine NEGATIVE NEGATIVE   Ketones, ur NEGATIVE NEGATIVE mg/dL   Protein, ur NEGATIVE NEGATIVE mg/dL   Nitrite NEGATIVE NEGATIVE   Leukocytes,Ua NEGATIVE NEGATIVE   RBC / HPF 6-10 0 - 5 RBC/hpf   WBC, UA 0-5 0 - 5 WBC/hpf    Comment:        Reflex urine culture not performed if WBC <=10, OR if Squamous epithelial cells >5. If Squamous epithelial cells >5 suggest recollection.    Bacteria, UA RARE (A) NONE SEEN   Squamous Epithelial / HPF 0-5 0 - 5 /HPF   Mucus PRESENT    Hyaline Casts, UA PRESENT     Comment: Performed at [facility], [address], [place], [place] [address]  HIV Antibody (routine testing w rflx)     Status: None   Collection Time: 09/04/22 10:19 AM  Result Value Ref Range   HIV Screen 4th Generation wRfx Non Reactive Non Reactive    Comment: Performed at [facility], [address], [place], [place] [address]  Procalcitonin     Status: None   Collection Time: 09/04/22 10:19 AM  Result Value Ref Range   Procalcitonin 0.10 ng/mL    Comment:        Interpretation: PCT (Procalcitonin) <= 0.5 ng/mL: Systemic infection (sepsis) is not likely. Local bacterial infection is possible. (NOTE)       Sepsis PCT Algorithm           Lower Respiratory Tract                                      Infection PCT Algorithm    ----------------------------     ----------------------------         PCT < 0.25 ng/mL                PCT < 0.10 ng/mL          Strongly encourage             Strongly discourage   discontinuation of antibiotics    initiation of antibiotics    ----------------------------     -----------------------------       PCT 0.25 - 0.50 ng/mL            PCT 0.10 - 0.25 ng/mL               OR       >80%  decrease in PCT            Discourage initiation of                                            antibiotics      Encourage discontinuation           of antibiotics    ----------------------------     -----------------------------  PCT >= 0.50 ng/mL              PCT 0.26 - 0.50 ng/mL               AND        <80% decrease in PCT             Encourage initiation of                                             antibiotics       Encourage continuation           of antibiotics    ----------------------------     -----------------------------        PCT >= 0.50 ng/mL                  PCT > 0.50 ng/mL               AND         increase in PCT                  Strongly encourage                                      initiation of antibiotics    Strongly encourage escalation           of antibiotics                                     -----------------------------                                           PCT <= 0.25 ng/mL                                                 OR                                        > 80% decrease in PCT                                      Discontinue / Do not initiate                                             antibiotics  Performed at [facility], [address], [place], [place] [address]    CT Renal Stone Study  Result Date: 09/04/2022 CLINICAL DATA:  67 year old female with history of abdominal and flank pain. Recent history of back surgery. EXAM: CT ABDOMEN AND PELVIS WITHOUT CONTRAST TECHNIQUE: Multidetector CT imaging of the abdomen and pelvis was performed following the standard protocol without  IV contrast. RADIATION DOSE REDUCTION: This exam was performed according to the departmental dose-optimization program which includes automated exposure control, adjustment of the mA and/or kV according to patient size and/or use of iterative reconstruction technique. COMPARISON:  CT of the abdomen and pelvis 06/05/2003. FINDINGS: Lower chest:  Atherosclerotic calcifications in the descending thoracic aorta. Small hiatal hernia. Hepatobiliary: Subcentimeter low-attenuation lesion in segment 2 of the liver, too small to characterize, but statistically likely a tiny cyst (no imaging follow-up recommended). No other definite suspicious appearing hepatic lesions are noted. Unenhanced appearance of the gallbladder is unremarkable. Pancreas: No definite pancreatic mass or peripancreatic fluid collections or inflammatory changes are noted on today's noncontrast CT examination. Spleen: Unremarkable. Adrenals/Urinary Tract: 2 cm low-attenuation lesion in the upper pole of the left kidney (axial image 27 of series 3), incompletely characterized on today's noncontrast CT examination, but statistically likely a cyst. No calcifications are identified within the collecting system of either kidney, along the course of either ureter, or within the lumen of the urinary bladder. No hydroureteronephrosis. Unenhanced appearance of the right kidney, bilateral adrenal glands and urinary bladder is unremarkable. Stomach/Bowel: Unenhanced appearance of the stomach is normal. No pathologic dilatation of small bowel or colon. Multiple colonic diverticuli are noted, most evident in the descending colon and sigmoid colon, without surrounding inflammatory changes to suggest an acute diverticulitis at this time. Normal appendix. Vascular/Lymphatic: Atherosclerosis in the abdominal aorta and pelvic vasculature. No lymphadenopathy noted in the abdomen or pelvis. Reproductive: Small calcifications are noted within the uterus, likely partially calcified fibroids. Ovaries are unremarkable in appearance. Other: No significant volume of ascites.  No pneumoperitoneum. Musculoskeletal: Postoperative changes of recent PLIF are noted at L4-L5 with interbody cages at L4-L5 interspace. There is some paraspinal fluid and gas, as well as a small amount of gas within the spinal canal, presumably  secondary to the recent surgery. No well organized paraspinal fluid collection noted to suggest abscess. There are no aggressive appearing lytic or blastic lesions noted in the visualized portions of the skeleton. IMPRESSION: 1. No acute abnormality in the abdomen or pelvis to account for the patient's symptoms. 2. Postoperative changes from recent PLIF at L4-L5 with expected postoperative findings, as above. 3. Colonic diverticulosis without evidence of acute diverticulitis at this time. 4. Aortic atherosclerosis. Electronically Signed   By: Trudie Reed M.D.   On: 09/04/2022 07:08   DG Chest Portable 1 View  Result Date: 09/04/2022 CLINICAL DATA:  Fevers EXAM: PORTABLE CHEST 1 VIEW COMPARISON:  None Available. FINDINGS: The heart size and mediastinal contours are within normal limits. Both lungs are clear. The visualized skeletal structures are unremarkable. IMPRESSION: No active disease. Electronically Signed   By: Alcide Clever M.D.   On: 09/04/2022 03:42   DG Lumbar Spine 2-3 Views  Result Date: 09/02/2022 CLINICAL DATA:  Elective surgery EXAM: LUMBAR SPINE - 2-3 VIEW COMPARISON:  MRI 06/29/2022 FINDINGS: Three low resolution intraoperative spot views of the lumbar spine. Total fluoroscopy time was 34 seconds, fluoroscopic dose of 33.86 mGy. Images demonstrate fixating screws and interbody device at what appears to be L4-L5 level. IMPRESSION: Intraoperative fluoroscopic assistance provided during lumbar spine surgery. Electronically Signed   By: Jasmine Pang M.D.   On: 09/02/2022 16:47   DG C-Arm 1-60 Min-No Report  Result Date: 09/02/2022 Fluoroscopy was utilized by the requesting physician.  No radiographic interpretation.   DG C-Arm 1-60 Min-No Report  Result Date: 09/02/2022 Fluoroscopy was utilized by the requesting physician.  No radiographic interpretation.  Pending Labs Unresulted Labs (From admission, onward)     Start     Ordered   09/05/22 0500  Basic metabolic panel   Tomorrow morning,   R        09/04/22 0928   09/05/22 0500  CBC  Tomorrow morning,   R        09/04/22 0928            Vitals/Pain Today's Vitals   09/04/22 1023 09/04/22 1100 09/04/22 1130 09/04/22 1215  BP:  (!) 123/42 (!) 130/48 (!) 117/44  Pulse:  (!) 102 (!) 103 (!) 102  Resp:   20   Temp: 99.2 F (37.3 C)     TempSrc: Oral     SpO2:  100% 100% 98%  Weight:      Height:      PainSc:        Isolation Precautions No active isolations  Medications Medications  allopurinol (ZYLOPRIM) tablet 100 mg (has no administration in time range)  atorvastatin (LIPITOR) tablet 20 mg (has no administration in time range)  metoprolol succinate (TOPROL-XL) 24 hr tablet 100 mg (has no administration in time range)  levothyroxine (SYNTHROID) tablet 125 mcg (has no administration in time range)  pantoprazole (PROTONIX) EC tablet 40 mg (has no administration in time range)  methocarbamol (ROBAXIN) tablet 500 mg (has no administration in time range)  enoxaparin (LOVENOX) injection 40 mg (has no administration in time range)  lactated ringers infusion ( Intravenous New Bag/Given 09/04/22 1016)  acetaminophen (TYLENOL) tablet 650 mg (has no administration in time range)    Or  acetaminophen (TYLENOL) suppository 650 mg (has no administration in time range)  docusate sodium (COLACE) capsule 100 mg (100 mg Oral Given 09/04/22 1012)  polyethylene glycol (MIRALAX / GLYCOLAX) packet 17 g (has no administration in time range)  bisacodyl (DULCOLAX) EC tablet 5 mg (has no administration in time range)  ondansetron (ZOFRAN) tablet 4 mg (has no administration in time range)    Or  ondansetron (ZOFRAN) injection 4 mg (has no administration in time range)  albuterol (PROVENTIL) (2.5 MG/3ML) 0.083% nebulizer solution 2.5 mg (has no administration in time range)  hydrALAZINE (APRESOLINE) injection 5 mg (has no administration in time range)  oxyCODONE-acetaminophen (PERCOCET/ROXICET) 5-325 MG per tablet  1 tablet (1 tablet Oral Given 09/04/22 1012)    And  oxyCODONE (Oxy IR/ROXICODONE) immediate release tablet 5 mg (has no administration in time range)  sodium chloride 0.9 % bolus 1,000 mL (0 mLs Intravenous Stopped 09/04/22 0636)  acetaminophen (TYLENOL) tablet 650 mg (650 mg Oral Given 09/04/22 0356)  sodium chloride 0.9 % bolus 1,000 mL (0 mLs Intravenous Stopped 09/04/22 0611)  sodium chloride 0.9 % bolus 1,000 mL (0 mLs Intravenous Stopped 09/04/22 0705)  cefTRIAXone (ROCEPHIN) 1 g in sodium chloride 0.9 % 100 mL IVPB (0 g Intravenous Stopped 09/04/22 0646)    Mobility walks     Focused Assessments Cardiac Assessment Handoff:    No results found for: "CKTOTAL", "CKMB", "CKMBINDEX", "TROPONINI" No results found for: "DDIMER" Does the Patient currently have chest pain? No    R Recommendations: See Admitting Provider Note  Report given to:   Additional Notes: Pt ambulates to bathroom, pt son at bedside .

## 2022-09-04 NOTE — ED Triage Notes (Signed)
The pt had back surgery in the last few days  she was discharged from here yesterday am

## 2022-09-04 NOTE — Progress Notes (Addendum)
67 y/o female s/p L4-5 laminectomy and PLIF / PSF on 09/02/22. She did extremely well initially post-op and was discharged home yesterday. She presented to the ED late last night for fever, chills, and increased back pain. She continues to have resolution of her pre-op radicular pain. Back pain as expected for being POD#2. Incision is healing well without signs of infection, bandages are c/d/I. Vitals showed she was hypotensive and labs showed AKI. A CT renal was done showing postoperative changes of recent PLIF at L4-L5 with interbody cages at L4-L5 interspace. There is some paraspinal fluid and gas, as well as a small amount of gas within the spinal canal, presumably secondary to the recent surgery. There are no well organized paraspinal fluid collection noted to suggest abscess / infection. No neurosurgical management is recommended at this time. Plan is for her to be admitted to medicine service for further work up / treatment. OK DVT chemoprophylaxis if needed since she is POD#2.   Emilee Hero, PA-C

## 2022-09-04 NOTE — H&P (Signed)
History and Physical    Patient: Stacey Powell WUJ:[id number] DOB: November 19, 1955 DOA: 09/04/2022 DOS: the patient was seen and examined on 09/04/2022 PCP: Charlane Ferretti, DO  Patient coming from: Home - lives alone; NOK: Son, [phone]   Chief Complaint: Fever  HPI: Stacey Powell is a 67 y.o. female with medical history significant of HTN and hypothyroidism presenting with fever.  She was admitted from 7/12-13 for uncomplicated laminectomy and fusion.  She developed fever and back pain overnight.   She had surgery Friday, and was doing fine.  She woke up about 11pm for pain pills.  She had taken muscle relaxer about 930.  After awakening, she went to the bathroom and was shivering.  She laid back down and awoke about 0100 and was burning hot, temp 102.  She called neurosurgery for guidance and they told her to come to the ER.  BP low while here, temp now afebrile.  No other symptoms.  She is having some urinary hesitation, felt like there was air in her urethra with wiping.  No dysuria - felt a burning sensation immediately post-op but none since.      ER Course:  Surgery <48 hours ago, no new back pain, has fever to 102.  Discharged yesterday AM.  Normal lactate.  Normal CXR, UA.  BPs soft, AKI.  Given Rocephin x 1.  Neurosurgery will see, thinks it is almost impossible to be a post-op wound infection this early.     Review of Systems: As mentioned in the history of present illness. All other systems reviewed and are negative. Past Medical History:  Diagnosis Date   Allergy    Arthritis    GERD (gastroesophageal reflux disease)    History of pneumonia    Hypertension    Hypothyroidism    Obesity    Pneumonia    about 67 years old   Thyroid disease    hypothyroid   Past Surgical History:  Procedure Laterality Date   CATARACT EXTRACTION Right 03/2022   COLONOSCOPY     KNEE ARTHROSCOPY     left knee   UPPER GASTROINTESTINAL ENDOSCOPY     Social History:  reports that she  quit smoking about 20 years ago. Her smoking use included cigarettes. She has never used smokeless tobacco. She reports current alcohol use. She reports that she does not use drugs.  Allergies  Allergen Reactions   Penicillins     Unknown reaction    Family History  Problem Relation Age of Onset   Cancer Paternal Grandmother        gallbladder    Stomach cancer Paternal Grandmother    Lung cancer Paternal Grandfather    Colon polyps Mother    Cystic fibrosis Other        grandson   Heart disease Maternal Grandfather    Diabetes Maternal Grandfather    Colon cancer Neg Hx    Rectal cancer Neg Hx    Esophageal cancer Neg Hx     Prior to Admission medications   Medication Sig Start Date End Date Taking? Authorizing Provider  allopurinol (ZYLOPRIM) 100 MG tablet Take 100 mg by mouth daily.    [provider]  atorvastatin (LIPITOR) 20 MG tablet Take 20 mg by mouth daily. 07/29/22   [provider]  hydrochlorothiazide (HYDRODIURIL) 25 MG tablet Take 25 mg by mouth daily. 04/07/17   [provider]  levothyroxine (SYNTHROID, LEVOTHROID) 125 MCG tablet Take 125 mcg by mouth daily before breakfast. 02/27/17  [provider]  losartan (COZAAR) 100 MG tablet Take 100 mg by mouth daily. 02/27/17   [provider]  methocarbamol (ROBAXIN) 500 MG tablet Take 1 tablet (500 mg total) by mouth every 6 (six) hours as needed for muscle spasms. 09/03/22   Iran Sizer, PA-C  metoprolol succinate (TOPROL-XL) 100 MG 24 hr tablet Take 100 mg by mouth daily. 04/07/17   [provider]  omeprazole (PRILOSEC) 20 MG capsule Take 20 mg by mouth daily. 03/11/17   [provider]  oxyCODONE-acetaminophen (PERCOCET) 10-325 MG tablet Take 1 tablet by mouth every 4 (four) hours as needed for up to 5 days for pain. 09/03/22 09/08/22  Iran Sizer, PA-C    Physical Exam: Vitals:   09/04/22 1100 09/04/22 1130 09/04/22 1215 09/04/22 1327  BP:  (!) 123/42 (!) 130/48 (!) 117/44 (!) 136/56  Pulse: (!) 102 (!) 103 (!) 102 100  Resp:  20 18 16   Temp:    100.3 F (37.9 C)  TempSrc:    Oral  SpO2: 100% 100% 98% 100%  Weight:      Height:       General:  Appears calm and comfortable and is in NAD Eyes:   EOMI, normal lids, iris ENT:  grossly normal hearing, lips & tongue, mmm Neck:  no LAD, masses or thyromegaly Cardiovascular:  RRR, no m/r/g. No LE edema.  Respiratory:   CTA bilaterally with no wheezes/rales/rhonchi.  Normal respiratory effort. Abdomen:  soft, NT, ND Back:   normal alignment, no TTP, surgical dressing in place and C/D/I, no surrounding erythema Skin:  no rash or induration seen on limited exam Musculoskeletal:  grossly normal tone BUE/BLE, good ROM, no bony abnormality Psychiatric:  grossly normal mood and affect, speech fluent and appropriate, AOx3 Neurologic:  CN 2-12 grossly intact, moves all extremities in coordinated fashion, sensation intact   Radiological Exams on Admission: Independently reviewed - see discussion in A/P where applicable  CT Renal Stone Study  Result Date: 09/04/2022 CLINICAL DATA:  67 year old female with history of abdominal and flank pain. Recent history of back surgery. EXAM: CT ABDOMEN AND PELVIS WITHOUT CONTRAST TECHNIQUE: Multidetector CT imaging of the abdomen and pelvis was performed following the standard protocol without IV contrast. RADIATION DOSE REDUCTION: This exam was performed according to the departmental dose-optimization program which includes automated exposure control, adjustment of the mA and/or kV according to patient size and/or use of iterative reconstruction technique. COMPARISON:  CT of the abdomen and pelvis 06/05/2003. FINDINGS: Lower chest: Atherosclerotic calcifications in the descending thoracic aorta. Small hiatal hernia. Hepatobiliary: Subcentimeter low-attenuation lesion in segment 2 of the liver, too small to characterize, but statistically likely a tiny  cyst (no imaging follow-up recommended). No other definite suspicious appearing hepatic lesions are noted. Unenhanced appearance of the gallbladder is unremarkable. Pancreas: No definite pancreatic mass or peripancreatic fluid collections or inflammatory changes are noted on today's noncontrast CT examination. Spleen: Unremarkable. Adrenals/Urinary Tract: 2 cm low-attenuation lesion in the upper pole of the left kidney (axial image 27 of series 3), incompletely characterized on today's noncontrast CT examination, but statistically likely a cyst. No calcifications are identified within the collecting system of either kidney, along the course of either ureter, or within the lumen of the urinary bladder. No hydroureteronephrosis. Unenhanced appearance of the right kidney, bilateral adrenal glands and urinary bladder is unremarkable. Stomach/Bowel: Unenhanced appearance of the stomach is normal. No pathologic dilatation of small bowel or colon. Multiple colonic diverticuli are noted, most evident  in the descending colon and sigmoid colon, without surrounding inflammatory changes to suggest an acute diverticulitis at this time. Normal appendix. Vascular/Lymphatic: Atherosclerosis in the abdominal aorta and pelvic vasculature. No lymphadenopathy noted in the abdomen or pelvis. Reproductive: Small calcifications are noted within the uterus, likely partially calcified fibroids. Ovaries are unremarkable in appearance. Other: No significant volume of ascites.  No pneumoperitoneum. Musculoskeletal: Postoperative changes of recent PLIF are noted at L4-L5 with interbody cages at L4-L5 interspace. There is some paraspinal fluid and gas, as well as a small amount of gas within the spinal canal, presumably secondary to the recent surgery. No well organized paraspinal fluid collection noted to suggest abscess. There are no aggressive appearing lytic or blastic lesions noted in the visualized portions of the skeleton. IMPRESSION: 1.  No acute abnormality in the abdomen or pelvis to account for the patient's symptoms. 2. Postoperative changes from recent PLIF at L4-L5 with expected postoperative findings, as above. 3. Colonic diverticulosis without evidence of acute diverticulitis at this time. 4. Aortic atherosclerosis. Electronically Signed   By: Trudie Reed M.D.   On: 09/04/2022 07:08   DG Chest Portable 1 View  Result Date: 09/04/2022 CLINICAL DATA:  Fevers EXAM: PORTABLE CHEST 1 VIEW COMPARISON:  None Available. FINDINGS: The heart size and mediastinal contours are within normal limits. Both lungs are clear. The visualized skeletal structures are unremarkable. IMPRESSION: No active disease. Electronically Signed   By: Alcide Clever M.D.   On: 09/04/2022 03:42    EKG:  none   Labs on Admission: I have personally reviewed the available labs and imaging studies at the time of the admission.  Pertinent labs:    Na++ 133 K+ 3.3 Glucose 129 BUN 26/Creatinine 1.9/GFR 29; 18/1.46/40 on 7/2 Lactate 1.4 WBC 13.2 Hgb 11.2 INR 1.1 UA: moderate Hgb, rare bacteria Blood cultures pending Procalcitonin 0.10   Assessment and Plan: Principal Problem:   Fever Active Problems:   Spondylolisthesis at L4-L5 level   Acute kidney injury superimposed on chronic kidney disease (HCC)   Essential hypertension   Dyslipidemia   Hypothyroidism    Fever Patient is in the near post-operative period with new onset fever Does not appear to have frank wound infection and the timing is suspect Mild leukocytosis, unremarkable procalcitonin UA unremarkable although she did have some mild transient symptoms CXR negative CT stone study unremarkable Blood cultures pending - transient bacteremia post-operatively is a consideration No other overt symptoms to suggest a definitive cause Will observe overnight on med surg She received 1 dose of Rocephin in the ER but will hold additional abx at this time  AKI on stage 3b CKD Recent  GFR was 40, currently 29 Suspect mild prerenal azotemia in the setting of recent surgery, fever Will repeat BMP in AM Avoid nephrotoxic agents where possible  Recent lumbar fusion L4-5 fusion on 7/12 As above, low current suspicion for wound infection Neurosurgery has been asked to consult Continue prn Percocet, metocarbamol  HTN Hold hydrochlorothiazide, losartan Continue Toprol XL  HLD Continue atorvastatin  Hypothyroidism Continue Synthroid   Advance Care Planning:   Code Status: Full Code - Code status was discussed with the patient at the time of admission.  The patient would want to receive full resuscitative measures at this time.   Consults: Neurosurgery  DVT Prophylaxis: Lovenox  Family Communication: Son was present throughout evaluation but was sleeping periodically  Severity of Illness: The appropriate patient status for this patient is OBSERVATION. Observation status is judged to be reasonable  and necessary in order to provide the required intensity of service to ensure the patient's safety. The patient's presenting symptoms, physical exam findings, and initial radiographic and laboratory data in the context of their medical condition is felt to place them at decreased risk for further clinical deterioration. Furthermore, it is anticipated that the patient will be medically stable for discharge from the hospital within 2 midnights of admission.   Author: Jonah Blue, MD 09/04/2022 2:02 PM  For on call review [email or web address].

## 2022-09-05 ENCOUNTER — Observation Stay (HOSPITAL_COMMUNITY): Payer: PPO

## 2022-09-05 DIAGNOSIS — R509 Fever, unspecified: Secondary | ICD-10-CM | POA: Diagnosis not present

## 2022-09-05 DIAGNOSIS — R609 Edema, unspecified: Secondary | ICD-10-CM

## 2022-09-05 LAB — CBC
HCT: 28.7 % — ABNORMAL LOW (ref 36.0–46.0)
Hemoglobin: 9.4 g/dL — ABNORMAL LOW (ref 12.0–15.0)
MCH: 29.7 pg (ref 26.0–34.0)
MCHC: 32.8 g/dL (ref 30.0–36.0)
MCV: 90.5 fL (ref 80.0–100.0)
Platelets: 203 10*3/uL (ref 150–400)
RBC: 3.17 MIL/uL — ABNORMAL LOW (ref 3.87–5.11)
RDW: 13.4 % (ref 11.5–15.5)
WBC: 11.5 10*3/uL — ABNORMAL HIGH (ref 4.0–10.5)
nRBC: 0 % (ref 0.0–0.2)

## 2022-09-05 LAB — BASIC METABOLIC PANEL
Anion gap: 8 (ref 5–15)
BUN: 15 mg/dL (ref 8–23)
CO2: 23 mmol/L (ref 22–32)
Calcium: 8 mg/dL — ABNORMAL LOW (ref 8.9–10.3)
Chloride: 106 mmol/L (ref 98–111)
Creatinine, Ser: 1.34 mg/dL — ABNORMAL HIGH (ref 0.44–1.00)
GFR, Estimated: 44 mL/min — ABNORMAL LOW (ref >60)
Glucose, Bld: 111 mg/dL — ABNORMAL HIGH (ref 70–99)
Potassium: 3.3 mmol/L — ABNORMAL LOW (ref 3.5–5.1)
Sodium: 137 mmol/L (ref 135–145)

## 2022-09-05 MED ORDER — POTASSIUM CHLORIDE CRYS ER 20 MEQ PO TBCR
20.0000 meq | EXTENDED_RELEASE_TABLET | Freq: Once | ORAL | Status: AC
  Administered 2022-09-05: 20 meq via ORAL
  Filled 2022-09-05: qty 1

## 2022-09-05 MED ORDER — POTASSIUM CHLORIDE IN NACL 20-0.45 MEQ/L-% IV SOLN
INTRAVENOUS | Status: DC
  Filled 2022-09-05 (×3): qty 1000

## 2022-09-05 MED ORDER — LACTATED RINGERS IV BOLUS
1000.0000 mL | Freq: Once | INTRAVENOUS | Status: AC
  Administered 2022-09-05: 1000 mL via INTRAVENOUS

## 2022-09-05 NOTE — Progress Notes (Signed)
[facility] night cross cover note:  I was notified by RN that this patient who is hospitalized for postoperative fever of unclear source, has a current temperature of 102F, relative to previous 24-hour Tmax of 101.7F.  Heart rates in the 90s to low 100s, consistent with heart rates throughout the day.  Blood pressure 98/43, appears slightly lower than previous blood pressures, as prior systolic values were noted to be in the 1 teens to 120s.  Respiratory rate 16-17 and oxygen saturation in the mid to high 90s on room air.  Not associate any new complaints.  Blood cultures x 2 were previously collected, with no growth to date at this time.  Urinalysis was reported to be inconsistent with UTI, chest x-ray negative, and CT renal stone was also unremarkable.  Not currently on any antibiotics given unclear source of patient's postoperative fever.  She is currently receiving acetaminophen for her elevated temperature.  As the blood pressure is slightly lower than prior, I have ordered a 1 L LR bolus, following which we will return to existing order for continuous LR running at 75 cc/h.  Will continue to monitor, and otherwise continue plan as outlined in admission H&P.    Newton Pigg, DO Hospitalist

## 2022-09-05 NOTE — Progress Notes (Signed)
Pt's temp increased again, this time to 102 F and BP 98/43. Notified Dr. Arlean Hopping per above. Tylenol given and bolus LR ordered and given. After bolus completed, BP 117/50 and temp 99.4 F. Pt still complaining of chills. Pt refused extra blankets and to have temp in room elevated at this time.

## 2022-09-05 NOTE — Progress Notes (Signed)
[facility] PROGRESS NOTE  Stacey Powell (DOB: June 20, 1955) UJW:[id number] PCP: Charlane Ferretti, DO Outpatient Specialists: Neurosurgery, Dr. Wynetta Emery  Brief Narrative: Stacey Powell is a 67 y.o. female with a history of L4-L5 spondylolisthesis with spinal stenosis s/p laminectomy interbody fusion 7/12, HTN, hypothyroidism who presented to the ED on 09/04/2022 due to fever at home. In the ED her BP was low and she was afebrile, though spiked a fever to 102F again. Work up has been reassuring and blood cultures pending, monitoring off antibiotics for now.   Subjective: Had sore throat after intubation for a few hours that resolved. Had irritation with urination after foley catheter was pulled which also resolved spontaneously. Denies cough, chest pain, abd pain, leg swelling, dyspnea.   Objective: BP (!) 129/47   Pulse (!) 106   Temp (!) 102 F (38.9 C) (Oral)   Resp 14   Ht 5\' 7"  (1.702 m)   Wt 98 kg   SpO2 97%   BMI 33.84 kg/m   Gen: No distress laying on left side Pulm: Clear, nonlabored, diminished  CV: RRR, no MRG, no pitting edema GI: Soft, NT, ND, +BS Neuro: Alert and oriented. No new focal deficits. Ext: Warm, no deformities. Skin: Midline lumbar incision seen thru honeycomb dressing to have well-apposed wound edges without significant erythema or any discharge, relatively not tender. No other rashes, lesions or ulcers on visualized skin   Assessment & Plan: Postoperative fever of unknown etiology: CXR, urinalysis, CT abd/pelvis w/o contrast did not show nidus of infection. Would be too early for wound infection and no evidence of this clinically either. Procalcitonin and lactic acid reassuringly negative. WBC nonspecifically elevated 13.2 > 11.5k.  - She remains febrile today and feels unwell with it. We will monitor blood cultures at least 48 hours or until alternative diagnosis is found. Hemodynamically stable to remain off antibiotics at this time (s/p CTX 1g in ED) -  Check LE venous U/S - Encourage frequent incentive spirometry.   Spondylolisthesis, spinal stenosis s/p L4-L5 laminectomy/fusion 7/12:  - Neurosurgery consulted, seen by Dr. Wynetta Emery. No postoperative complications suspected at this time - Continue pain control. If fever continues, may need to attempt discontinuation of opioids.   AKI on stage IIIa CKD: Improving. Hyaline casts on UA, 6-10 RBCs pt known to have had cystoscopy for microscopic hematuria in the past.  - Continue IVF as she continues to have poor oral intake due to fever.  Hypokalemia:  - Supplement and monitor  HTN: With hypotension on arrival likely due to analgesic and HTN agents with some dehydration. Now has become hypertensive again.  - Continue metoprolol - Consider restart losartan if CrCl continues to stabilize. Holding HCTZ with hypokalemia.  - Continue prn hydralazine  GERD:  - Continue PPI  Hypothyroidism:  - Continue synthroid. Unclear utility of testing TSH in perioperative period, and no clinical symptoms/signs to specifically suggest incorrect supplement dosing.   HLD:  - Continue statin.  Tyrone Nine, MD [facility] [email or web address] 09/05/2022, 2:02 PM

## 2022-09-05 NOTE — TOC CM/SW Note (Signed)
Transition of Care [facility]) - Inpatient Brief Assessment   Patient Details  Name: Stacey Powell MRN: [id number] Date of Birth: 1956-01-30  Transition of Care [facility]) CM/SW Contact:    Harriet Masson, RN Phone Number: 09/05/2022, 10:24 AM   Clinical Narrative:  Patient had laminectomy on 09/02/22. Patient admitted for fever.  TOC following. Transition of Care Asessment: Insurance and Status: Insurance coverage has been reviewed Patient has primary care physician: Yes Home environment has been reviewed: safe to discharge home when medically stable Prior level of function:: fine at home Prior/Current Home Services: No current home services Social Determinants of Health Reivew: SDOH reviewed no interventions necessary Readmission risk has been reviewed: Yes Transition of care needs: no transition of care needs at this time

## 2022-09-05 NOTE — Care Management Obs Status (Signed)
MEDICARE OBSERVATION STATUS NOTIFICATION   Patient Details  Name: Stacey Powell MRN: [id number] Date of Birth: Apr 14, 1955   Medicare Observation Status Notification Given:  Yes    Harriet Masson, RN 09/05/2022, 10:23 AM

## 2022-09-05 NOTE — Plan of Care (Signed)
Patient alert/oriented X4. Patient compliant with medication administration. Patient had an oral temperature of 102, tylenol administered, rechecked and then a temperature of 100.7. Patient tolerated cont. IV fluids. Patient has no complaints at this time.   Problem: Education: Goal: Ability to verbalize activity precautions or restrictions will improve Outcome: Progressing   Problem: Activity: Goal: Ability to avoid complications of mobility impairment will improve Outcome: Progressing   Problem: Activity: Goal: Ability to tolerate increased activity will improve Outcome: Progressing   Problem: Activity: Goal: Will remain free from falls Outcome: Progressing   Problem: Bowel/Gastric: Goal: Gastrointestinal status for postoperative course will improve Outcome: Progressing   Problem: Clinical Measurements: Goal: Ability to maintain clinical measurements within normal limits will improve Outcome: Progressing   Problem: Clinical Measurements: Goal: Postoperative complications will be avoided or minimized Outcome: Progressing   Problem: Clinical Measurements: Goal: Diagnostic test results will improve Outcome: Progressing   Problem: Pain Management: Goal: Pain level will decrease Outcome: Progressing   Problem: Skin Integrity: Goal: Will show signs of wound healing Outcome: Progressing   Problem: Health Behavior/Discharge Planning: Goal: Identification of resources available to assist in meeting health care needs will improve Outcome: Progressing   Problem: Bladder/Genitourinary: Goal: Urinary functional status for postoperative course will improve Outcome: Progressing   Problem: Education: Goal: Knowledge of General Education information will improve Description: Including pain rating scale, medication(s)/side effects and non-pharmacologic comfort measures Outcome: Progressing   Problem: Health Behavior/Discharge Planning: Goal: Ability to manage health-related  needs will improve Outcome: Progressing   Problem: Clinical Measurements: Goal: Ability to maintain clinical measurements within normal limits will improve Outcome: Progressing   Problem: Clinical Measurements: Goal: Will remain free from infection Outcome: Progressing   Problem: Clinical Measurements: Goal: Diagnostic test results will improve Outcome: Progressing   Problem: Clinical Measurements: Goal: Respiratory complications will improve Outcome: Progressing   Problem: Clinical Measurements: Goal: Cardiovascular complication will be avoided Outcome: Progressing   Problem: Activity: Goal: Risk for activity intolerance will decrease Outcome: Progressing   Problem: Nutrition: Goal: Adequate nutrition will be maintained Outcome: Progressing   Problem: Coping: Goal: Level of anxiety will decrease Outcome: Progressing   Problem: Elimination: Goal: Will not experience complications related to bowel motility Outcome: Progressing   Problem: Elimination: Goal: Will not experience complications related to urinary retention Outcome: Progressing   Problem: Pain Managment: Goal: General experience of comfort will improve Outcome: Progressing   Problem: Safety: Goal: Ability to remain free from injury will improve Outcome: Progressing   Problem: Skin Integrity: Goal: Risk for impaired skin integrity will decrease Outcome: Progressing

## 2022-09-05 NOTE — Progress Notes (Signed)
Bilateral lower extremity venous study completed.   Preliminary results relayed to RN.  Please see CV Procedures for preliminary results.  Bemnet Trovato, RVT  2:03 PM 09/05/22

## 2022-09-05 NOTE — Progress Notes (Signed)
TR[facility] night cross cover note:   I ordered soap suds enema per patient's request.    Newton Pigg, DO Hospitalist

## 2022-09-06 DIAGNOSIS — E039 Hypothyroidism, unspecified: Secondary | ICD-10-CM | POA: Diagnosis not present

## 2022-09-06 DIAGNOSIS — Z1152 Encounter for screening for COVID-19: Secondary | ICD-10-CM | POA: Diagnosis not present

## 2022-09-06 DIAGNOSIS — Z79890 Hormone replacement therapy (postmenopausal): Secondary | ICD-10-CM | POA: Diagnosis not present

## 2022-09-06 DIAGNOSIS — E876 Hypokalemia: Secondary | ICD-10-CM | POA: Diagnosis not present

## 2022-09-06 DIAGNOSIS — Z801 Family history of malignant neoplasm of trachea, bronchus and lung: Secondary | ICD-10-CM | POA: Diagnosis not present

## 2022-09-06 DIAGNOSIS — N1832 Chronic kidney disease, stage 3b: Secondary | ICD-10-CM | POA: Diagnosis not present

## 2022-09-06 DIAGNOSIS — R5082 Postprocedural fever: Secondary | ICD-10-CM | POA: Diagnosis present

## 2022-09-06 DIAGNOSIS — D649 Anemia, unspecified: Secondary | ICD-10-CM | POA: Diagnosis not present

## 2022-09-06 DIAGNOSIS — Z981 Arthrodesis status: Secondary | ICD-10-CM | POA: Diagnosis not present

## 2022-09-06 DIAGNOSIS — Z87891 Personal history of nicotine dependence: Secondary | ICD-10-CM | POA: Diagnosis not present

## 2022-09-06 DIAGNOSIS — Z880 Allergy status to penicillin: Secondary | ICD-10-CM | POA: Diagnosis not present

## 2022-09-06 DIAGNOSIS — Z83719 Family history of colon polyps, unspecified: Secondary | ICD-10-CM | POA: Diagnosis not present

## 2022-09-06 DIAGNOSIS — Z833 Family history of diabetes mellitus: Secondary | ICD-10-CM | POA: Diagnosis not present

## 2022-09-06 DIAGNOSIS — Z800 Family history of malignant neoplasm of digestive organs: Secondary | ICD-10-CM | POA: Diagnosis not present

## 2022-09-06 DIAGNOSIS — M4316 Spondylolisthesis, lumbar region: Secondary | ICD-10-CM | POA: Diagnosis not present

## 2022-09-06 DIAGNOSIS — N179 Acute kidney failure, unspecified: Secondary | ICD-10-CM | POA: Diagnosis not present

## 2022-09-06 DIAGNOSIS — K219 Gastro-esophageal reflux disease without esophagitis: Secondary | ICD-10-CM | POA: Diagnosis not present

## 2022-09-06 DIAGNOSIS — Z8249 Family history of ischemic heart disease and other diseases of the circulatory system: Secondary | ICD-10-CM | POA: Diagnosis not present

## 2022-09-06 DIAGNOSIS — R509 Fever, unspecified: Secondary | ICD-10-CM

## 2022-09-06 DIAGNOSIS — E785 Hyperlipidemia, unspecified: Secondary | ICD-10-CM | POA: Diagnosis not present

## 2022-09-06 DIAGNOSIS — I129 Hypertensive chronic kidney disease with stage 1 through stage 4 chronic kidney disease, or unspecified chronic kidney disease: Secondary | ICD-10-CM | POA: Diagnosis not present

## 2022-09-06 DIAGNOSIS — E669 Obesity, unspecified: Secondary | ICD-10-CM | POA: Diagnosis not present

## 2022-09-06 DIAGNOSIS — E860 Dehydration: Secondary | ICD-10-CM | POA: Diagnosis not present

## 2022-09-06 DIAGNOSIS — Z8701 Personal history of pneumonia (recurrent): Secondary | ICD-10-CM | POA: Diagnosis not present

## 2022-09-06 DIAGNOSIS — M4800 Spinal stenosis, site unspecified: Secondary | ICD-10-CM | POA: Diagnosis not present

## 2022-09-06 LAB — BASIC METABOLIC PANEL
Anion gap: 7 (ref 5–15)
BUN: 14 mg/dL (ref 8–23)
CO2: 23 mmol/L (ref 22–32)
Calcium: 7.9 mg/dL — ABNORMAL LOW (ref 8.9–10.3)
Chloride: 107 mmol/L (ref 98–111)
Creatinine, Ser: 1.21 mg/dL — ABNORMAL HIGH (ref 0.44–1.00)
GFR, Estimated: 50 mL/min — ABNORMAL LOW (ref >60)
Glucose, Bld: 114 mg/dL — ABNORMAL HIGH (ref 70–99)
Potassium: 3.4 mmol/L — ABNORMAL LOW (ref 3.5–5.1)
Sodium: 137 mmol/L (ref 135–145)

## 2022-09-06 LAB — CBC
HCT: 26.6 % — ABNORMAL LOW (ref 36.0–46.0)
Hemoglobin: 8.8 g/dL — ABNORMAL LOW (ref 12.0–15.0)
MCH: 30.1 pg (ref 26.0–34.0)
MCHC: 33.1 g/dL (ref 30.0–36.0)
MCV: 91.1 fL (ref 80.0–100.0)
Platelets: 174 10*3/uL (ref 150–400)
RBC: 2.92 MIL/uL — ABNORMAL LOW (ref 3.87–5.11)
RDW: 13.5 % (ref 11.5–15.5)
WBC: 10.4 10*3/uL (ref 4.0–10.5)
nRBC: 0 % (ref 0.0–0.2)

## 2022-09-06 NOTE — TOC Transition Note (Signed)
Transition of Care [facility]) - CM/SW Discharge Note   Patient Details  Name: Stacey Powell MRN: [id number] Date of Birth: [birth year]  [facility]) CM/SW Contact:  Tom-Johnson, Hershal Coria, RN Phone Number: 09/06/2022, 1:41 PM   Clinical Narrative:     Patient is scheduled for discharge today.  Hospital f/u and discharge instructions on AVS. No TOC needs or recommendations noted. Family to transport at discharge.  No further TOC needs noted.        Patient Goals and CMS Choice      Discharge Placement                         Discharge Plan and Services Additional resources added to the After Visit Summary for                                       Social Determinants of Health (SDOH) Interventions SDOH Screenings   Food Insecurity: No Food Insecurity (09/04/2022)  Housing: Low Risk  (09/04/2022)  Transportation Needs: No Transportation Needs (09/04/2022)  Utilities: Not At Risk (09/04/2022)  Tobacco Use: Medium Risk (09/04/2022)     Readmission Risk Interventions     No data to display

## 2022-09-06 NOTE — Progress Notes (Signed)
Subjective: Patient reports  patient feels feels little better this morning low-grade temp is still slightly elevated  Objective: Vital signs in last 24 hours: Temp:  [99 F (37.2 C)-102 F (38.9 C)] 99.7 F (37.6 C) (07/16 0731) Pulse Rate:  [89-106] 94 (07/16 0731) Resp:  [18] 18 (07/16 0731) BP: (116-141)/(47-61) 139/54 (07/16 0731) SpO2:  [98 %-100 %] 98 % (07/16 0731)  Intake/Output from previous day: 07/15 0701 - 07/16 0700 In: 141.9 [I.V.:141.9] Out: -  Intake/Output this shift: No intake/output data recorded.  Minimal back and leg pain.  Lab Results: Recent Labs    09/05/22 0248 09/06/22 0043  WBC 11.5* 10.4  HGB 9.4* 8.8*  HCT 28.7* 26.6*  PLT 203 174   BMET Recent Labs    09/05/22 0248 09/06/22 0043  NA 137 137  K 3.3* 3.4*  CL 106 107  CO2 23 23  GLUCOSE 111* 114*  BUN 15 14  CREATININE 1.34* 1.21*  CALCIUM 8.0* 7.9*    Studies/Results: VAS [place] LOWER EXTREMITY VENOUS (DVT)  Result Date: 09/05/2022  Lower Venous DVT Study Patient Name:  Stacey Powell  Date of Exam:   09/05/2022 Medical Rec #: [id number]        Accession #:    [id number] Date of Birth: 09/29/1955       Patient Gender: F Patient Age:   67 years Exam Location:  [facility] Procedure:      VAS [place] LOWER EXTREMITY VENOUS (DVT) Referring Phys: Hazeline Junker --------------------------------------------------------------------------------  Indications: Edema, back surgery 09/02/22.  Comparison Study: No previous study. Performing Technologist: McKayla Maag RVT, [place]  Examination Guidelines: A complete evaluation includes B-mode imaging, spectral Doppler, color Doppler, and power Doppler as needed of all accessible portions of each vessel. Bilateral testing is considered an integral part of a complete examination. Limited examinations for reoccurring indications may be performed as noted. The reflux portion of the exam is performed with the patient in reverse Trendelenburg.   +---------+---------------+---------+-----------+----------+--------------+ RIGHT    CompressibilityPhasicitySpontaneityPropertiesThrombus Aging +---------+---------------+---------+-----------+----------+--------------+ CFV      Full           Yes      Yes                                 +---------+---------------+---------+-----------+----------+--------------+ SFJ      Full                                                        +---------+---------------+---------+-----------+----------+--------------+ FV Prox  Full                                                        +---------+---------------+---------+-----------+----------+--------------+ FV Mid   Full                                                        +---------+---------------+---------+-----------+----------+--------------+ FV DistalFull                                                        +---------+---------------+---------+-----------+----------+--------------+  PFV      Full                                                        +---------+---------------+---------+-----------+----------+--------------+ POP      Full           Yes      Yes                                 +---------+---------------+---------+-----------+----------+--------------+ PTV      Full                                                        +---------+---------------+---------+-----------+----------+--------------+ PERO     Full                                                        +---------+---------------+---------+-----------+----------+--------------+   +---------+---------------+---------+-----------+----------+--------------+ LEFT     CompressibilityPhasicitySpontaneityPropertiesThrombus Aging +---------+---------------+---------+-----------+----------+--------------+ CFV      Full           Yes      Yes                                  +---------+---------------+---------+-----------+----------+--------------+ SFJ      Full                                                        +---------+---------------+---------+-----------+----------+--------------+ FV Prox  Full                                                        +---------+---------------+---------+-----------+----------+--------------+ FV Mid   Full                                                        +---------+---------------+---------+-----------+----------+--------------+ FV DistalFull                                                        +---------+---------------+---------+-----------+----------+--------------+ PFV      Full                                                        +---------+---------------+---------+-----------+----------+--------------+  POP      Full           Yes      Yes                                 +---------+---------------+---------+-----------+----------+--------------+ PTV      Full                                                        +---------+---------------+---------+-----------+----------+--------------+ PERO     Full                                                        +---------+---------------+---------+-----------+----------+--------------+     Summary: RIGHT: - There is no evidence of deep vein thrombosis in the lower extremity.  - A cystic structure is found in the popliteal fossa.  LEFT: - There is no evidence of deep vein thrombosis in the lower extremity.  - No cystic structure found in the popliteal fossa.  *See table(s) above for measurements and observations. Electronically signed by Lemar Livings MD on 09/05/2022 at 7:13:22 PM.    Final     Assessment/Plan: Continue workup of fever of unknown origin.  Patient's continue to work on bowel regimen to have a bowel movement.  Ultrasound negative for DVT unclear source of fever however patient seems to be improving okay to  discharge from a neurosurgical perspective await hear from medicine.  LOS: 0 days     Mariam Dollar 09/06/2022, 8:22 AM

## 2022-09-06 NOTE — Plan of Care (Signed)

## 2022-09-06 NOTE — Discharge Summary (Signed)
Physician Discharge Summary   Patient: Stacey Powell MRN: [id number] DOB: August 08, 1955  Admit date:     09/04/2022  Discharge date: 09/06/22  Discharge Physician: Tyrone Nine   PCP: Charlane Ferretti, DO   Recommendations at discharge:  Follow up with PCP in 1-2 weeks for ongoing routine care. Recommend repeat CBC, with attention toward normocytic anemia, and BMP with attention toward hypokalemia and AKI on stage IIIa CKD. Follow up with neurosurgery, Dr. Wynetta Emery, to be arranged per their office.  Discharge Diagnoses: Principal Problem:   Fever Active Problems:   Spondylolisthesis at L4-L5 level   Acute kidney injury superimposed on chronic kidney disease (HCC)   Essential hypertension   Dyslipidemia   Hypothyroidism   Postoperative fever  Hospital Course: Stacey Powell is a 67 y.o. female with a history of L4-L5 spondylolisthesis with spinal stenosis s/p laminectomy interbody fusion 7/12, HTN, hypothyroidism who presented to the ED on 09/04/2022 due to fever at home. In the ED her BP was low and she spiked a fever to 102F again. She had leukocytosis which has resolved, she remained febrile requiring continued monitoring and work up. Blood cultures drawn at admission have remained without growth at 48 hours. DVT has been ruled out. There is no evidence of infectious nidus at this time. The patient is hemodynamically stable now and cleared for discharge by the medical and neurosurgical teams with strict return precautions.  Assessment and Plan: Postoperative fever of unknown etiology: CXR, urinalysis, CT abd/pelvis w/o contrast did not show nidus of infection. Would be too early for wound infection and no evidence of this clinically either. Procalcitonin and lactic acid reassuringly negative. WBC nonspecifically elevated 13.2k and has normalized as of day of discharge. Has been afebrile x24 hours and feels well. No DVT on LE venous U/S.  - Blood cultures remain NGTD x48 hours, will be  followed by discharging physician after discharge. Given the patient has reliable follow up and good health literacy/awareness and hemodynamically has stabilized, we will discharge her today with the blessing of NSG.     Spondylolisthesis, spinal stenosis s/p L4-L5 laminectomy/fusion 7/12:  - Neurosurgery consulted, seen by Dr. Wynetta Emery, will follow up after discharge per routine.   - Continue pain control.    AKI on stage IIIa CKD: Improving. Hyaline casts on UA, 6-10 RBCs pt known to have had cystoscopy for microscopic hematuria in the past.  - Renal function has improved. CrCl now 17ml/min and pt taking adequate po to maintain hydration and nutritional needs.   Hypokalemia:  - Supplemented, suggest recheck at follow up.    HTN: With hypotension on arrival likely due to analgesic and HTN agents with some dehydration. Now has become hypertensive again.  - Continue metoprolol, restart losartan. While recovering from AKI will hold HCTZ for now with hypokalemia.  - Continue prn hydralazine   GERD:  - Continue PPI   Hypothyroidism:  - Continue synthroid. Unclear utility of testing TSH in perioperative period, and no clinical symptoms/signs to specifically suggest incorrect supplement dosing.   Normocytic anemia: Accentuated by IV fluids. There is no evidence of bleeding at this time.  - Recheck at follow up is recommended.  Consultants: Neurosurgery Procedures performed: None  Disposition: Home Diet recommendation: Cardiac DISCHARGE MEDICATION: Allergies as of 09/06/2022       Reactions   Penicillins    Unknown reaction        Medication List     STOP taking these medications    hydrochlorothiazide 25 MG  tablet Commonly known as: HYDRODIURIL       TAKE these medications    allopurinol 100 MG tablet Commonly known as: ZYLOPRIM Take 100 mg by mouth daily.   atorvastatin 20 MG tablet Commonly known as: LIPITOR Take 20 mg by mouth daily.   levothyroxine 125 MCG  tablet Commonly known as: SYNTHROID Take 125 mcg by mouth daily before breakfast.   losartan 100 MG tablet Commonly known as: COZAAR Take 100 mg by mouth daily.   methocarbamol 500 MG tablet Commonly known as: ROBAXIN Take 1 tablet (500 mg total) by mouth every 6 (six) hours as needed for muscle spasms.   metoprolol succinate 100 MG 24 hr tablet Commonly known as: TOPROL-XL Take 100 mg by mouth daily.   omeprazole 20 MG capsule Commonly known as: PRILOSEC Take 20 mg by mouth daily.   oxyCODONE-acetaminophen 10-325 MG tablet Commonly known as: Percocet Take 1 tablet by mouth every 4 (four) hours as needed for up to 5 days for pain.        Follow-up Information     Charlane Ferretti, DO. Schedule an appointment as soon as possible for a visit in 1 week(s).   Specialty: Internal Medicine Contact information: [address] [place] [address] [phone]         Donalee Citrin, MD Follow up.   Specialty: Neurosurgery Contact information: [address] [place] [address] [phone]                Discharge Exam: Ceasar Mons Weights   09/04/22 0238  Weight: 98 kg  BP (!) 139/54 (BP Location: Left Arm)   Pulse 94   Temp 99.7 F (37.6 C) (Oral)   Resp 18   Ht 5\' 7"  (1.702 m)   Wt 98 kg   SpO2 98%   BMI 33.84 kg/m   Pleasant 67yo F in no distress with son at bedside. Ate breakfast, ambulating without issues, requesting enema. No fever since yesterday, though low grade temperature remains. No cough, dyspnea, new pain/abd pain, urinary complaints. Clear, nonlabored on room air supine RRR, no MRG, no pitting edema Soft, NT, ND, hypoactive BS Midline lumbar incision site has been c/d/i  Condition at discharge: stable  The results of significant diagnostics from this hospitalization (including imaging, microbiology, ancillary and laboratory) are listed below for reference.   Imaging Studies: VAS [place] LOWER EXTREMITY VENOUS (DVT)  Result  Date: 09/05/2022  Lower Venous DVT Study Patient Name:  Stacey Powell  Date of Exam:   09/05/2022 Medical Rec #: [id number]        Accession #:    [id number] Date of Birth: 05-31-1955       Patient Gender: F Patient Age:   67 years Exam Location:  [facility] Procedure:      VAS [place] LOWER EXTREMITY VENOUS (DVT) Referring Phys: Hazeline Junker --------------------------------------------------------------------------------  Indications: Edema, back surgery 09/02/22.  Comparison Study: No previous study. Performing Technologist: McKayla Maag RVT, [place]  Examination Guidelines: A complete evaluation includes B-mode imaging, spectral Doppler, color Doppler, and power Doppler as needed of all accessible portions of each vessel. Bilateral testing is considered an integral part of a complete examination. Limited examinations for reoccurring indications may be performed as noted. The reflux portion of the exam is performed with the patient in reverse Trendelenburg.  +---------+---------------+---------+-----------+----------+--------------+ RIGHT    CompressibilityPhasicitySpontaneityPropertiesThrombus Aging +---------+---------------+---------+-----------+----------+--------------+ CFV      Full           Yes  Yes                                 +---------+---------------+---------+-----------+----------+--------------+ SFJ      Full                                                        +---------+---------------+---------+-----------+----------+--------------+ FV Prox  Full                                                        +---------+---------------+---------+-----------+----------+--------------+ FV Mid   Full                                                        +---------+---------------+---------+-----------+----------+--------------+ FV DistalFull                                                         +---------+---------------+---------+-----------+----------+--------------+ PFV      Full                                                        +---------+---------------+---------+-----------+----------+--------------+ POP      Full           Yes      Yes                                 +---------+---------------+---------+-----------+----------+--------------+ PTV      Full                                                        +---------+---------------+---------+-----------+----------+--------------+ PERO     Full                                                        +---------+---------------+---------+-----------+----------+--------------+   +---------+---------------+---------+-----------+----------+--------------+ LEFT     CompressibilityPhasicitySpontaneityPropertiesThrombus Aging +---------+---------------+---------+-----------+----------+--------------+ CFV      Full           Yes      Yes                                 +---------+---------------+---------+-----------+----------+--------------+ SFJ      Full                                                        +---------+---------------+---------+-----------+----------+--------------+  FV Prox  Full                                                        +---------+---------------+---------+-----------+----------+--------------+ FV Mid   Full                                                        +---------+---------------+---------+-----------+----------+--------------+ FV DistalFull                                                        +---------+---------------+---------+-----------+----------+--------------+ PFV      Full                                                        +---------+---------------+---------+-----------+----------+--------------+ POP      Full           Yes      Yes                                  +---------+---------------+---------+-----------+----------+--------------+ PTV      Full                                                        +---------+---------------+---------+-----------+----------+--------------+ PERO     Full                                                        +---------+---------------+---------+-----------+----------+--------------+     Summary: RIGHT: - There is no evidence of deep vein thrombosis in the lower extremity.  - A cystic structure is found in the popliteal fossa.  LEFT: - There is no evidence of deep vein thrombosis in the lower extremity.  - No cystic structure found in the popliteal fossa.  *See table(s) above for measurements and observations. Electronically signed by Lemar Livings MD on 09/05/2022 at 7:13:22 PM.    Final    CT Renal Stone Study  Result Date: 09/04/2022 CLINICAL DATA:  67 year old female with history of abdominal and flank pain. Recent history of back surgery. EXAM: CT ABDOMEN AND PELVIS WITHOUT CONTRAST TECHNIQUE: Multidetector CT imaging of the abdomen and pelvis was performed following the standard protocol without IV contrast. RADIATION DOSE REDUCTION: This exam was performed according to the departmental dose-optimization program which includes automated exposure control, adjustment of the mA and/or kV according to patient size and/or use of iterative reconstruction technique. COMPARISON:  CT of the abdomen and pelvis 06/05/2003. FINDINGS:  Lower chest: Atherosclerotic calcifications in the descending thoracic aorta. Small hiatal hernia. Hepatobiliary: Subcentimeter low-attenuation lesion in segment 2 of the liver, too small to characterize, but statistically likely a tiny cyst (no imaging follow-up recommended). No other definite suspicious appearing hepatic lesions are noted. Unenhanced appearance of the gallbladder is unremarkable. Pancreas: No definite pancreatic mass or peripancreatic fluid collections or inflammatory changes  are noted on today's noncontrast CT examination. Spleen: Unremarkable. Adrenals/Urinary Tract: 2 cm low-attenuation lesion in the upper pole of the left kidney (axial image 27 of series 3), incompletely characterized on today's noncontrast CT examination, but statistically likely a cyst. No calcifications are identified within the collecting system of either kidney, along the course of either ureter, or within the lumen of the urinary bladder. No hydroureteronephrosis. Unenhanced appearance of the right kidney, bilateral adrenal glands and urinary bladder is unremarkable. Stomach/Bowel: Unenhanced appearance of the stomach is normal. No pathologic dilatation of small bowel or colon. Multiple colonic diverticuli are noted, most evident in the descending colon and sigmoid colon, without surrounding inflammatory changes to suggest an acute diverticulitis at this time. Normal appendix. Vascular/Lymphatic: Atherosclerosis in the abdominal aorta and pelvic vasculature. No lymphadenopathy noted in the abdomen or pelvis. Reproductive: Small calcifications are noted within the uterus, likely partially calcified fibroids. Ovaries are unremarkable in appearance. Other: No significant volume of ascites.  No pneumoperitoneum. Musculoskeletal: Postoperative changes of recent PLIF are noted at L4-L5 with interbody cages at L4-L5 interspace. There is some paraspinal fluid and gas, as well as a small amount of gas within the spinal canal, presumably secondary to the recent surgery. No well organized paraspinal fluid collection noted to suggest abscess. There are no aggressive appearing lytic or blastic lesions noted in the visualized portions of the skeleton. IMPRESSION: 1. No acute abnormality in the abdomen or pelvis to account for the patient's symptoms. 2. Postoperative changes from recent PLIF at L4-L5 with expected postoperative findings, as above. 3. Colonic diverticulosis without evidence of acute diverticulitis at this  time. 4. Aortic atherosclerosis. Electronically Signed   By: Trudie Reed M.D.   On: 09/04/2022 07:08   DG Chest Portable 1 View  Result Date: 09/04/2022 CLINICAL DATA:  Fevers EXAM: PORTABLE CHEST 1 VIEW COMPARISON:  None Available. FINDINGS: The heart size and mediastinal contours are within normal limits. Both lungs are clear. The visualized skeletal structures are unremarkable. IMPRESSION: No active disease. Electronically Signed   By: Alcide Clever M.D.   On: 09/04/2022 03:42   DG Lumbar Spine 2-3 Views  Result Date: 09/02/2022 CLINICAL DATA:  Elective surgery EXAM: LUMBAR SPINE - 2-3 VIEW COMPARISON:  MRI 06/29/2022 FINDINGS: Three low resolution intraoperative spot views of the lumbar spine. Total fluoroscopy time was 34 seconds, fluoroscopic dose of 33.86 mGy. Images demonstrate fixating screws and interbody device at what appears to be L4-L5 level. IMPRESSION: Intraoperative fluoroscopic assistance provided during lumbar spine surgery. Electronically Signed   By: Jasmine Pang M.D.   On: 09/02/2022 16:47   DG C-Arm 1-60 Min-No Report  Result Date: 09/02/2022 Fluoroscopy was utilized by the requesting physician.  No radiographic interpretation.   DG C-Arm 1-60 Min-No Report  Result Date: 09/02/2022 Fluoroscopy was utilized by the requesting physician.  No radiographic interpretation.    Microbiology: Results for orders placed or performed during the hospital encounter of 09/04/22  Culture, blood (Routine x 2)     Status: None (Preliminary result)   Collection Time: 09/04/22  2:45 AM   Specimen: BLOOD  Result Value Ref Range  Status   Specimen Description BLOOD BLOOD RIGHT ARM  Final   Special Requests   Final    BOTTLES DRAWN AEROBIC AND ANAEROBIC Blood Culture adequate volume   Culture   Final    NO GROWTH 2 DAYS Performed at [facility], [address], [place], [place] [address]    Report Status PENDING  Incomplete  Culture, blood (Routine x 2)     Status: None  (Preliminary result)   Collection Time: 09/04/22  2:50 AM   Specimen: BLOOD  Result Value Ref Range Status   Specimen Description BLOOD BLOOD LEFT ARM  Final   Special Requests   Final    BOTTLES DRAWN AEROBIC ONLY Blood Culture adequate volume   Culture   Final    NO GROWTH 2 DAYS Performed at [facility], [address], [place], [place] [address]    Report Status PENDING  Incomplete  SARS Coronavirus 2 by RT PCR (hospital order, performed in [facility] hospital lab) *cepheid single result test* Anterior Nasal Swab     Status: None   Collection Time: 09/04/22  3:18 AM   Specimen: Anterior Nasal Swab  Result Value Ref Range Status   SARS Coronavirus 2 by RT PCR NEGATIVE NEGATIVE Final    Comment: Performed at [facility], [address] [place], [place] [address]    Labs: CBC: Recent Labs  Lab 09/04/22 0300 09/05/22 0248 09/06/22 0043  WBC 13.2* 11.5* 10.4  NEUTROABS 9.8*  --   --   HGB 11.2* 9.4* 8.8*  HCT 34.4* 28.7* 26.6*  MCV 93.5 90.5 91.1  PLT 279 203 174   Basic Metabolic Panel: Recent Labs  Lab 09/04/22 0300 09/05/22 0248 09/06/22 0043  NA 133* 137 137  K 3.3* 3.3* 3.4*  CL 102 106 107  CO2 21* 23 23  GLUCOSE 129* 111* 114*  BUN 26* 15 14  CREATININE 1.90* 1.34* 1.21*  CALCIUM 8.3* 8.0* 7.9*   Liver Function Tests: Recent Labs  Lab 09/04/22 0300  AST 14*  ALT 11  ALKPHOS 51  BILITOT 0.5  PROT 6.0*  ALBUMIN 3.5   CBG: No results for input(s): "GLUCAP" in the last 168 hours.  Discharge time spent: greater than 30 minutes.  Signed: Tyrone Nine, MD [facility] 09/06/2022

## 2022-09-06 NOTE — Progress Notes (Signed)
Patient discharged.  Removed PIV.  Reviewed discharge instructions, medications and follow up appts with patient and son.  There were no new prescriptions.  Answered questions.  Gave patient copy of discharge instructions.     No additional questions or concerns at this time.

## 2022-09-09 LAB — CULTURE, BLOOD (ROUTINE X 2)
Culture: NO GROWTH
Culture: NO GROWTH
Special Requests: ADEQUATE
Special Requests: ADEQUATE

## 2022-09-12 DIAGNOSIS — D649 Anemia, unspecified: Secondary | ICD-10-CM | POA: Diagnosis not present

## 2022-09-12 DIAGNOSIS — I129 Hypertensive chronic kidney disease with stage 1 through stage 4 chronic kidney disease, or unspecified chronic kidney disease: Secondary | ICD-10-CM | POA: Diagnosis not present

## 2022-09-12 DIAGNOSIS — M47816 Spondylosis without myelopathy or radiculopathy, lumbar region: Secondary | ICD-10-CM | POA: Diagnosis not present

## 2022-09-12 DIAGNOSIS — E039 Hypothyroidism, unspecified: Secondary | ICD-10-CM | POA: Diagnosis not present

## 2022-09-12 DIAGNOSIS — N1831 Chronic kidney disease, stage 3a: Secondary | ICD-10-CM | POA: Diagnosis not present

## 2022-09-12 DIAGNOSIS — R5082 Postprocedural fever: Secondary | ICD-10-CM | POA: Diagnosis not present

## 2022-09-12 DIAGNOSIS — N179 Acute kidney failure, unspecified: Secondary | ICD-10-CM | POA: Diagnosis not present

## 2022-09-12 DIAGNOSIS — K219 Gastro-esophageal reflux disease without esophagitis: Secondary | ICD-10-CM | POA: Diagnosis not present

## 2022-09-12 DIAGNOSIS — E876 Hypokalemia: Secondary | ICD-10-CM | POA: Diagnosis not present

## 2022-09-15 DIAGNOSIS — D649 Anemia, unspecified: Secondary | ICD-10-CM | POA: Diagnosis not present

## 2022-10-20 DIAGNOSIS — M4316 Spondylolisthesis, lumbar region: Secondary | ICD-10-CM | POA: Diagnosis not present

## 2022-12-01 DIAGNOSIS — M4316 Spondylolisthesis, lumbar region: Secondary | ICD-10-CM | POA: Diagnosis not present

## 2022-12-01 DIAGNOSIS — Z6835 Body mass index (BMI) 35.0-35.9, adult: Secondary | ICD-10-CM | POA: Diagnosis not present

## 2022-12-16 DIAGNOSIS — H25812 Combined forms of age-related cataract, left eye: Secondary | ICD-10-CM | POA: Diagnosis not present

## 2022-12-16 DIAGNOSIS — Z961 Presence of intraocular lens: Secondary | ICD-10-CM | POA: Diagnosis not present

## 2022-12-16 DIAGNOSIS — H43393 Other vitreous opacities, bilateral: Secondary | ICD-10-CM | POA: Diagnosis not present

## 2023-01-25 DIAGNOSIS — R7989 Other specified abnormal findings of blood chemistry: Secondary | ICD-10-CM | POA: Diagnosis not present

## 2023-01-25 DIAGNOSIS — N1831 Chronic kidney disease, stage 3a: Secondary | ICD-10-CM | POA: Diagnosis not present

## 2023-01-25 DIAGNOSIS — G8929 Other chronic pain: Secondary | ICD-10-CM | POA: Diagnosis not present

## 2023-01-25 DIAGNOSIS — M109 Gout, unspecified: Secondary | ICD-10-CM | POA: Diagnosis not present

## 2023-01-25 DIAGNOSIS — M1990 Unspecified osteoarthritis, unspecified site: Secondary | ICD-10-CM | POA: Diagnosis not present

## 2023-01-25 DIAGNOSIS — D649 Anemia, unspecified: Secondary | ICD-10-CM | POA: Diagnosis not present

## 2023-01-25 DIAGNOSIS — I10 Essential (primary) hypertension: Secondary | ICD-10-CM | POA: Diagnosis not present

## 2023-01-25 DIAGNOSIS — R7301 Impaired fasting glucose: Secondary | ICD-10-CM | POA: Diagnosis not present

## 2023-01-25 DIAGNOSIS — M25579 Pain in unspecified ankle and joints of unspecified foot: Secondary | ICD-10-CM | POA: Diagnosis not present

## 2023-01-25 DIAGNOSIS — E785 Hyperlipidemia, unspecified: Secondary | ICD-10-CM | POA: Diagnosis not present

## 2023-01-25 DIAGNOSIS — E039 Hypothyroidism, unspecified: Secondary | ICD-10-CM | POA: Diagnosis not present

## 2023-03-16 DIAGNOSIS — Z1231 Encounter for screening mammogram for malignant neoplasm of breast: Secondary | ICD-10-CM | POA: Diagnosis not present

## 2023-04-06 DIAGNOSIS — M4316 Spondylolisthesis, lumbar region: Secondary | ICD-10-CM | POA: Diagnosis not present

## 2023-04-06 DIAGNOSIS — Z6836 Body mass index (BMI) 36.0-36.9, adult: Secondary | ICD-10-CM | POA: Diagnosis not present

## 2023-04-13 DIAGNOSIS — I10 Essential (primary) hypertension: Secondary | ICD-10-CM | POA: Diagnosis not present

## 2023-04-13 DIAGNOSIS — E039 Hypothyroidism, unspecified: Secondary | ICD-10-CM | POA: Diagnosis not present

## 2023-04-13 DIAGNOSIS — D649 Anemia, unspecified: Secondary | ICD-10-CM | POA: Diagnosis not present

## 2023-04-13 DIAGNOSIS — M25579 Pain in unspecified ankle and joints of unspecified foot: Secondary | ICD-10-CM | POA: Diagnosis not present

## 2023-04-13 DIAGNOSIS — N1831 Chronic kidney disease, stage 3a: Secondary | ICD-10-CM | POA: Diagnosis not present

## 2023-04-13 DIAGNOSIS — I129 Hypertensive chronic kidney disease with stage 1 through stage 4 chronic kidney disease, or unspecified chronic kidney disease: Secondary | ICD-10-CM | POA: Diagnosis not present

## 2023-04-13 DIAGNOSIS — E785 Hyperlipidemia, unspecified: Secondary | ICD-10-CM | POA: Diagnosis not present

## 2023-04-13 DIAGNOSIS — R82998 Other abnormal findings in urine: Secondary | ICD-10-CM | POA: Diagnosis not present

## 2023-04-13 DIAGNOSIS — M109 Gout, unspecified: Secondary | ICD-10-CM | POA: Diagnosis not present

## 2023-04-13 DIAGNOSIS — M79672 Pain in left foot: Secondary | ICD-10-CM | POA: Diagnosis not present

## 2023-04-13 DIAGNOSIS — F430 Acute stress reaction: Secondary | ICD-10-CM | POA: Diagnosis not present

## 2023-04-13 DIAGNOSIS — R3121 Asymptomatic microscopic hematuria: Secondary | ICD-10-CM | POA: Diagnosis not present
